# Patient Record
Sex: Female | Born: 1970
Health system: Southern US, Community
[De-identification: ages and names within clinical notes are randomized; demographics above are authoritative.]

## PROBLEM LIST (undated history)

## (undated) DIAGNOSIS — T7840XA Allergy, unspecified, initial encounter: Secondary | ICD-10-CM

## (undated) DIAGNOSIS — J45909 Unspecified asthma, uncomplicated: Secondary | ICD-10-CM

## (undated) HISTORY — DX: Unspecified asthma, uncomplicated: J45.909

## (undated) HISTORY — DX: Allergy, unspecified, initial encounter: T78.40XA

---

## 1999-02-26 ENCOUNTER — Encounter (INDEPENDENT_AMBULATORY_CARE_PROVIDER_SITE_OTHER): Payer: Self-pay | Admitting: *Deleted

## 1999-02-26 LAB — CONVERTED CEMR LAB

## 1999-09-07 ENCOUNTER — Encounter: Admission: RE | Admit: 1999-09-07 | Discharge: 1999-09-07 | Payer: Self-pay | Admitting: Family Medicine

## 1999-10-06 ENCOUNTER — Encounter: Admission: RE | Admit: 1999-10-06 | Discharge: 1999-10-06 | Payer: Self-pay | Admitting: Family Medicine

## 1999-12-06 ENCOUNTER — Encounter: Admission: RE | Admit: 1999-12-06 | Discharge: 1999-12-06 | Payer: Self-pay | Admitting: Family Medicine

## 1999-12-20 ENCOUNTER — Encounter: Admission: RE | Admit: 1999-12-20 | Discharge: 1999-12-20 | Payer: Self-pay | Admitting: Family Medicine

## 2000-01-17 ENCOUNTER — Encounter: Admission: RE | Admit: 2000-01-17 | Discharge: 2000-01-17 | Payer: Self-pay | Admitting: Family Medicine

## 2000-04-07 ENCOUNTER — Encounter: Admission: RE | Admit: 2000-04-07 | Discharge: 2000-04-07 | Payer: Self-pay | Admitting: Family Medicine

## 2000-04-26 ENCOUNTER — Encounter: Admission: RE | Admit: 2000-04-26 | Discharge: 2000-04-26 | Payer: Self-pay | Admitting: Family Medicine

## 2000-05-03 ENCOUNTER — Encounter: Admission: RE | Admit: 2000-05-03 | Discharge: 2000-05-03 | Payer: Self-pay | Admitting: Family Medicine

## 2000-05-09 ENCOUNTER — Ambulatory Visit (HOSPITAL_BASED_OUTPATIENT_CLINIC_OR_DEPARTMENT_OTHER): Admission: RE | Admit: 2000-05-09 | Discharge: 2000-05-09 | Payer: Self-pay | Admitting: Otolaryngology

## 2000-06-21 ENCOUNTER — Encounter: Admission: RE | Admit: 2000-06-21 | Discharge: 2000-06-21 | Payer: Self-pay | Admitting: Family Medicine

## 2003-12-23 ENCOUNTER — Other Ambulatory Visit: Admission: RE | Admit: 2003-12-23 | Discharge: 2003-12-23 | Payer: Self-pay | Admitting: Family Medicine

## 2004-07-27 ENCOUNTER — Ambulatory Visit (HOSPITAL_COMMUNITY): Admission: RE | Admit: 2004-07-27 | Discharge: 2004-07-27 | Payer: Self-pay | Admitting: Family Medicine

## 2005-11-01 ENCOUNTER — Other Ambulatory Visit: Admission: RE | Admit: 2005-11-01 | Discharge: 2005-11-01 | Payer: Self-pay | Admitting: Family Medicine

## 2006-08-25 ENCOUNTER — Encounter (INDEPENDENT_AMBULATORY_CARE_PROVIDER_SITE_OTHER): Payer: Self-pay | Admitting: *Deleted

## 2006-11-09 ENCOUNTER — Other Ambulatory Visit: Admission: RE | Admit: 2006-11-09 | Discharge: 2006-11-09 | Payer: Self-pay | Admitting: Family Medicine

## 2008-01-08 ENCOUNTER — Other Ambulatory Visit: Admission: RE | Admit: 2008-01-08 | Discharge: 2008-01-08 | Payer: Self-pay | Admitting: Family Medicine

## 2008-03-30 ENCOUNTER — Emergency Department (HOSPITAL_COMMUNITY): Admission: EM | Admit: 2008-03-30 | Discharge: 2008-03-30 | Payer: Self-pay | Admitting: Emergency Medicine

## 2008-10-20 ENCOUNTER — Encounter: Admission: RE | Admit: 2008-10-20 | Discharge: 2008-10-20 | Payer: Self-pay | Admitting: Pulmonary Disease

## 2009-04-20 ENCOUNTER — Other Ambulatory Visit: Admission: RE | Admit: 2009-04-20 | Discharge: 2009-04-20 | Payer: Self-pay | Admitting: Family Medicine

## 2010-04-27 ENCOUNTER — Other Ambulatory Visit: Admission: RE | Admit: 2010-04-27 | Discharge: 2010-04-27 | Payer: Self-pay | Admitting: Family Medicine

## 2010-07-17 ENCOUNTER — Encounter: Payer: Self-pay | Admitting: Family Medicine

## 2010-07-20 ENCOUNTER — Ambulatory Visit (HOSPITAL_COMMUNITY)
Admission: RE | Admit: 2010-07-20 | Discharge: 2010-07-20 | Payer: Self-pay | Source: Home / Self Care | Attending: Family Medicine | Admitting: Family Medicine

## 2010-11-12 NOTE — Op Note (Signed)
[place]. [facility]  Patient:    Suzanne Webster, Suzanne Webster                       MRN: [id number] Proc. Date: 05/09/00 Adm. Date:  [id number] Attending:  Carlean Purl CC:         Dr. Arlie Solomons, [facility]   Operative Report  PREOPERATIVE DIAGNOSIS:  Right tympanic membrane perforation with conductive hearing loss.  POSTOPERATIVE DIAGNOSIS:  Right tympanic membrane perforation with conductive hearing loss.  OPERATION:  Postauricular approach to right tympanoplasty.  SURGEON:  Kristine Garbe. Ezzard Standing, M.D.  ANESTHESIA:  General endotracheal.  COMPLICATIONS:  None.  BRIEF CLINICAL NOTE:  Suzanne Webster is a 40 year old female who has had longstanding right TM perforation.  On exam in the office, she has an approximately 50% perforation of her right TM with the entire anterior half of the TM perforated.  She has a fair amount of tympanosclerosis on the TM in addition to her conductive hearing loss.  FINDINGS AT SURGERY:  A 50% anterior central TM perforation, tympanosclerosis of the posterior portion of the TM, partial erosion of the long process of the incus with fibrous adhesion to the stapes superstructure.  DESCRIPTION OF PROCEDURE:  After adequate endotracheal anesthesia, the patient received 1 g of Ancef IV preoperatively.  Ear was prepped with Betadine solution and draped out with sterile towels.  The ear was then irrigated with saline and cleaned.  On examination, the patient had a large anterior TM perforation.  Had a fair amount of tympanosclerosis over the posterior portion of the TM.  A postauricular incision was made, and a temporalis fascia graft was harvested and set aside to dry.  The postauricular incision was then extended inferiorly.  The ear was reflected anteriorly, and the ear canal was entered through a postauricular approach.  This allowed better visualization of the anterior portion of the perforation.  The  perforation edges were then freshened up with a pick and cup forceps.  A posterior-based tympanomeatal flap was then elevated, and the middle ear space was entered after elevating the annulus.  There were some adhesions over the long process of the incus, and these were lysed with the pick.  There was partial erosion of the long process of the incus and more of a fibrous union between the incus and stapes superstructure, but this union was still intact and it was elected not to perform any type of ossiculoplasty at this point.  After releasing some adhesion around the stapes superstructure, the stapes was mobile, as was the incus.  The temporalis fascia graft was then cut to appropriate size.  This was positioned underneath the malleus and medial to the tympanomeatal flap, covering the entire anterior one-half of the undersurface of the TM, covering the entire perforation site.  The middle ear space was then packed with Gelfoam soaked in colimycin.  The fascia graft and posterior-based tympanomeatal flap was then brought back in the ear canal, and the fascial graft was seen to cover the entire perforation site.  The ear canal was then packed with Gelfoam soaked in colimycin.  The ear was brought back.  The postauricular incision was closed with 3-0 chromic suture subcutaneously and 4-0 nylon on the skin.  Remaining ear canal was then packed with Gelfoam soaked in colimycin and a mastoid dressing was applied.  The patient was awakened from anesthesia and transferred to recovery postoperatively doing well.  DISPOSITION:  Suzanne Webster is discharged home  later this morning on Keflex 500 mg b.i.d. for four days, Tylenol and Tylenol No. 3 p.r.n. pain.  We will have her follow up in my office in six days for a recheck and removal of postauricular sutures. DD:  05/09/00 TD:  05/09/00 Job: [id number]/[id number]

## 2011-04-05 ENCOUNTER — Other Ambulatory Visit: Payer: Self-pay | Admitting: Gastroenterology

## 2011-04-05 DIAGNOSIS — R1011 Right upper quadrant pain: Secondary | ICD-10-CM

## 2011-04-11 ENCOUNTER — Ambulatory Visit
Admission: RE | Admit: 2011-04-11 | Discharge: 2011-04-11 | Disposition: A | Discharge status: Home / Self Care | Payer: BC Managed Care – PPO | Source: Ambulatory Visit | Attending: Gastroenterology | Admitting: Gastroenterology

## 2011-04-11 DIAGNOSIS — R1011 Right upper quadrant pain: Secondary | ICD-10-CM

## 2011-05-02 ENCOUNTER — Other Ambulatory Visit: Payer: Self-pay | Admitting: Family Medicine

## 2011-05-02 ENCOUNTER — Other Ambulatory Visit (HOSPITAL_COMMUNITY)
Admission: RE | Admit: 2011-05-02 | Discharge: 2011-05-02 | Disposition: A | Discharge status: Home / Self Care | Payer: BC Managed Care – PPO | Source: Ambulatory Visit | Attending: Family Medicine | Admitting: Family Medicine

## 2011-05-02 DIAGNOSIS — Z01419 Encounter for gynecological examination (general) (routine) without abnormal findings: Secondary | ICD-10-CM | POA: Insufficient documentation

## 2011-05-02 DIAGNOSIS — Z113 Encounter for screening for infections with a predominantly sexual mode of transmission: Secondary | ICD-10-CM | POA: Insufficient documentation

## 2011-09-08 ENCOUNTER — Other Ambulatory Visit (HOSPITAL_COMMUNITY): Payer: Self-pay | Admitting: Family Medicine

## 2011-09-08 DIAGNOSIS — Z1231 Encounter for screening mammogram for malignant neoplasm of breast: Secondary | ICD-10-CM

## 2011-10-10 ENCOUNTER — Ambulatory Visit (HOSPITAL_COMMUNITY)
Admission: RE | Admit: 2011-10-10 | Discharge: 2011-10-10 | Disposition: A | Discharge status: Home / Self Care | Payer: BC Managed Care – PPO | Source: Ambulatory Visit | Attending: Family Medicine | Admitting: Family Medicine

## 2011-10-10 DIAGNOSIS — Z1231 Encounter for screening mammogram for malignant neoplasm of breast: Secondary | ICD-10-CM | POA: Insufficient documentation

## 2011-10-13 ENCOUNTER — Other Ambulatory Visit: Payer: Self-pay | Admitting: Gastroenterology

## 2011-10-13 DIAGNOSIS — D1803 Hemangioma of intra-abdominal structures: Secondary | ICD-10-CM

## 2011-10-18 ENCOUNTER — Other Ambulatory Visit: Payer: Self-pay | Admitting: Family Medicine

## 2011-10-18 DIAGNOSIS — R928 Other abnormal and inconclusive findings on diagnostic imaging of breast: Secondary | ICD-10-CM

## 2011-10-20 ENCOUNTER — Ambulatory Visit
Admission: RE | Admit: 2011-10-20 | Discharge: 2011-10-20 | Disposition: A | Discharge status: Home / Self Care | Payer: BC Managed Care – PPO | Source: Ambulatory Visit | Attending: Family Medicine | Admitting: Family Medicine

## 2011-10-20 DIAGNOSIS — R928 Other abnormal and inconclusive findings on diagnostic imaging of breast: Secondary | ICD-10-CM

## 2011-10-21 ENCOUNTER — Other Ambulatory Visit: Payer: Self-pay

## 2011-10-25 ENCOUNTER — Ambulatory Visit
Admission: RE | Admit: 2011-10-25 | Discharge: 2011-10-25 | Disposition: A | Discharge status: Home / Self Care | Payer: BC Managed Care – PPO | Source: Ambulatory Visit | Attending: Gastroenterology | Admitting: Gastroenterology

## 2011-10-25 DIAGNOSIS — D1803 Hemangioma of intra-abdominal structures: Secondary | ICD-10-CM

## 2012-03-15 ENCOUNTER — Encounter (HOSPITAL_COMMUNITY): Payer: Self-pay

## 2012-03-15 ENCOUNTER — Emergency Department (HOSPITAL_COMMUNITY)
Admission: EM | Admit: 2012-03-15 | Discharge: 2012-03-16 | Disposition: A | Discharge status: Home / Self Care | Payer: Self-pay | Attending: Emergency Medicine | Admitting: Emergency Medicine

## 2012-03-15 DIAGNOSIS — R5381 Other malaise: Secondary | ICD-10-CM | POA: Insufficient documentation

## 2012-03-15 DIAGNOSIS — M791 Myalgia, unspecified site: Secondary | ICD-10-CM

## 2012-03-15 DIAGNOSIS — R079 Chest pain, unspecified: Secondary | ICD-10-CM | POA: Insufficient documentation

## 2012-03-15 DIAGNOSIS — R51 Headache: Secondary | ICD-10-CM | POA: Insufficient documentation

## 2012-03-15 DIAGNOSIS — IMO0001 Reserved for inherently not codable concepts without codable children: Secondary | ICD-10-CM | POA: Insufficient documentation

## 2012-03-15 DIAGNOSIS — R531 Weakness: Secondary | ICD-10-CM

## 2012-03-15 DIAGNOSIS — R110 Nausea: Secondary | ICD-10-CM | POA: Insufficient documentation

## 2012-03-15 NOTE — ED Notes (Addendum)
Pt presents with multiple complaints of chest, head, neck, right leg, and lower back. Chest pain is intermittent. Right leg pain radiates from calf to buttocks. All symptoms started four days ago. Pt reports weakness, dizziness, and nausea. Pt also reports persistent thirst for four days. Pt started taking Mobic and Flexiril eight days ago for migraine headaches. Last dose of Mobic was this am. Skin is warm and dry, respirations are equal and unlabored, pt is A&Ox4. EKG has been done

## 2012-03-16 ENCOUNTER — Emergency Department (HOSPITAL_COMMUNITY): Payer: Self-pay

## 2012-03-16 LAB — D-DIMER, QUANTITATIVE: D-Dimer, Quant: <0.27 ug/mL-FEU (ref 0.00–0.48)

## 2012-03-16 LAB — CBC WITH DIFFERENTIAL/PLATELET
Basophils Absolute: 0 10*3/uL (ref 0.0–0.1)
Eosinophils Relative: 1 % (ref 0–5)
HCT: 40.6 % (ref 36.0–46.0)
Hemoglobin: 14 g/dL (ref 12.0–15.0)
Lymphocytes Relative: 33 % (ref 12–46)
Lymphs Abs: 2.2 10*3/uL (ref 0.7–4.0)
MCV: 87.1 fL (ref 78.0–100.0)
Monocytes Absolute: 0.7 10*3/uL (ref 0.1–1.0)
Monocytes Relative: 10 % (ref 3–12)
Neutro Abs: 3.8 10*3/uL (ref 1.7–7.7)
RBC: 4.66 MIL/uL (ref 3.87–5.11)
WBC: 6.8 10*3/uL (ref 4.0–10.5)

## 2012-03-16 LAB — BASIC METABOLIC PANEL
BUN: 9 mg/dL (ref 6–23)
CO2: 23 mEq/L (ref 19–32)
Chloride: 103 mEq/L (ref 96–112)
Creatinine, Ser: 0.71 mg/dL (ref 0.50–1.10)
Glucose, Bld: 100 mg/dL — ABNORMAL HIGH (ref 70–99)

## 2012-03-16 LAB — URINE MICROSCOPIC-ADD ON

## 2012-03-16 LAB — URINALYSIS, ROUTINE W REFLEX MICROSCOPIC
Bilirubin Urine: NEGATIVE
Glucose, UA: NEGATIVE mg/dL
Nitrite: NEGATIVE
Specific Gravity, Urine: 1.012 (ref 1.005–1.030)
pH: 6 (ref 5.0–8.0)

## 2012-03-16 MED ORDER — DIPHENHYDRAMINE HCL 50 MG/ML IJ SOLN
25.0000 mg | Freq: Once | INTRAMUSCULAR | Status: AC
  Administered 2012-03-16: 25 mg via INTRAVENOUS
  Filled 2012-03-16: qty 1

## 2012-03-16 MED ORDER — TRAMADOL HCL 50 MG PO TABS: 50.0000 mg | ORAL_TABLET | Freq: Four times a day (QID) | ORAL | Status: DC | PRN

## 2012-03-16 MED ORDER — SODIUM CHLORIDE 0.9 % IV SOLN: 1000.0000 mL | INTRAVENOUS | Status: DC

## 2012-03-16 MED ORDER — SODIUM CHLORIDE 0.9 % IV SOLN
1000.0000 mL | Freq: Once | INTRAVENOUS | Status: AC
  Administered 2012-03-16: 1000 mL via INTRAVENOUS

## 2012-03-16 MED ORDER — METOCLOPRAMIDE HCL 5 MG/ML IJ SOLN
10.0000 mg | Freq: Once | INTRAMUSCULAR | Status: AC
  Administered 2012-03-16: 10 mg via INTRAVENOUS
  Filled 2012-03-16: qty 2

## 2012-03-16 NOTE — ED Provider Notes (Signed)
History     CSN: [id number]  Arrival date & time 03/15/12  2143   First MD Initiated Contact with Patient 03/15/12 2348      Chief Complaint  Patient presents with  . Pain    (Consider location/radiation/quality/duration/timing/severity/associated sxs/prior treatment) HPI 41 year old female presents to the emergency room with multiple complaints. She reports she has had 4 days of posterior headache radiating down her neck, generalized weakness, fatigue, nausea. She has had no vomiting. She has been extremely thirsty for the last 4 days as well. Patient reports she's been taking Mobic and Flexeril for the last several days for intermittent headaches. She reports no improvement with these medications. No fever no chills, no sick contacts. She has had no cough. Patient reports shortness of breath and one to 2 seconds of chest and flank pain, which occur both in left and right side. Patient reports a burning sensation from her right knee up to her buttock with prolonged standing. She denies any urinary or bowel dysfunction. She denies any swelling to her lower extremities, she is not a smoker, no history of PE DVT, no prolonged immobilization or other risk factors for blood clots.  History reviewed. No pertinent past medical history.  History reviewed. No pertinent past surgical history.  No family history on file.  History  Substance Use Topics  . Smoking status: Never Smoker   . Smokeless tobacco: Not on file  . Alcohol Use: No    OB History    Grav Para Term Preterm Abortions TAB SAB Ect Mult Living                  Review of Systems  All other systems reviewed and are negative.    Allergies  Review of patient's allergies indicates no known allergies.  Home Medications   Current Outpatient Rx  Name Route Sig Dispense Refill  . CYCLOBENZAPRINE HCL 10 MG PO TABS Oral Take 10 mg by mouth at bedtime.    . MELOXICAM 15 MG PO TABS Oral Take 15 mg by mouth daily.    . ADULT  MULTIVITAMIN W/MINERALS CH Oral Take 1 tablet by mouth daily.    . TRAMADOL HCL 50 MG PO TABS Oral Take 1 tablet (50 mg total) by mouth every 6 (six) hours as needed for pain. 15 tablet 0    BP 122/71  Pulse 79  Temp 98.4 F (36.9 C) (Oral)  Resp 16  Ht 5\' 7"  (1.702 m)  Wt 152 lb (68.947 kg)  BMI 23.81 kg/m2  SpO2 100%  LMP 02/25/2012  Physical Exam  Nursing note and vitals reviewed. Constitutional: She is oriented to person, place, and time. She appears well-developed and well-nourished. She appears distressed (Anxious appearing, uncomfortable).  HENT:  Head: Normocephalic and atraumatic.  Nose: Nose normal.  Mouth/Throat: Oropharynx is clear and moist.  Eyes: Conjunctivae normal and EOM are normal. Pupils are equal, round, and reactive to light.  Neck: Normal range of motion. Neck supple. No JVD present. No tracheal deviation present. No thyromegaly present.  Cardiovascular: Normal rate, regular rhythm, normal heart sounds and intact distal pulses.  Exam reveals no gallop and no friction rub.   No murmur heard. Pulmonary/Chest: Effort normal and breath sounds normal. No stridor. No respiratory distress. She has no wheezes. She has no rales. She exhibits no tenderness.  Abdominal: Soft. Bowel sounds are normal. She exhibits no distension and no mass. There is no tenderness. There is no rebound and no guarding.  Musculoskeletal: Normal range  of motion. She exhibits tenderness (tenderness to palpation and straight leg raise of her right leg without significant abnormalities noted). She exhibits no edema.  Lymphadenopathy:    She has no cervical adenopathy.  Neurological: She is alert and oriented to person, place, and time. She exhibits normal muscle tone. Coordination normal.  Skin: Skin is warm and dry. No rash noted. No erythema. No pallor.  Psychiatric: She has a normal mood and affect. Her behavior is normal. Judgment and thought content normal.    ED Course  Procedures  (including critical care time)  Labs Reviewed  BASIC METABOLIC PANEL - Abnormal; Notable for the following:    Glucose, Bld 100 (*)     All other components within normal limits  URINALYSIS, ROUTINE W REFLEX MICROSCOPIC - Abnormal; Notable for the following:    Hgb urine dipstick TRACE (*)     All other components within normal limits  URINE MICROSCOPIC-ADD ON - Abnormal; Notable for the following:    Squamous Epithelial / LPF FEW (*)     All other components within normal limits  CBC WITH DIFFERENTIAL  D-DIMER, QUANTITATIVE   Dg Chest 2 View  03/16/2012  *RADIOLOGY REPORT*  Clinical Data: Chest pain and shortness of breath.  CHEST - 2 VIEW  Comparison: Single view of the chest 10/20/2008.  Findings: Lungs are clear.  Heart size is normal.  No pneumothorax or pleural fluid.  IMPRESSION: Negative chest.   Original Report Authenticated By: Bernadene Bell. Maricela Curet, M.D.     Date: 03/15/2012  Rate: 108  Rhythm: sinus tachycardia  QRS Axis: normal  Intervals: normal  ST/T Wave abnormalities: normal  Conduction Disutrbances:none  Narrative Interpretation:   Old EKG Reviewed: none available    1. Myalgia   2. Headache   3. Weakness generalized       MDM  41 year old female with multiple complaints, workup unremarkable. She is feeling better after treatment. We'll discharge to followup with her primary care Dr.        Olivia Mackie, MD 03/16/12 [phone]

## 2012-07-20 ENCOUNTER — Other Ambulatory Visit (HOSPITAL_COMMUNITY)
Admission: RE | Admit: 2012-07-20 | Discharge: 2012-07-20 | Disposition: A | Discharge status: Home / Self Care | Payer: 59 | Source: Ambulatory Visit | Attending: Family Medicine | Admitting: Family Medicine

## 2012-07-20 ENCOUNTER — Other Ambulatory Visit: Payer: Self-pay | Admitting: Family Medicine

## 2012-07-20 DIAGNOSIS — Z01419 Encounter for gynecological examination (general) (routine) without abnormal findings: Secondary | ICD-10-CM | POA: Insufficient documentation

## 2012-10-24 ENCOUNTER — Other Ambulatory Visit: Payer: Self-pay | Admitting: Gastroenterology

## 2012-10-24 DIAGNOSIS — D1803 Hemangioma of intra-abdominal structures: Secondary | ICD-10-CM

## 2012-10-24 DIAGNOSIS — R51 Headache: Secondary | ICD-10-CM

## 2012-10-29 ENCOUNTER — Ambulatory Visit
Admission: RE | Admit: 2012-10-29 | Discharge: 2012-10-29 | Disposition: A | Discharge status: Home / Self Care | Payer: 59 | Source: Ambulatory Visit | Attending: Gastroenterology | Admitting: Gastroenterology

## 2012-10-29 DIAGNOSIS — D1803 Hemangioma of intra-abdominal structures: Secondary | ICD-10-CM

## 2012-12-12 ENCOUNTER — Other Ambulatory Visit (HOSPITAL_COMMUNITY): Payer: Self-pay | Admitting: Family Medicine

## 2012-12-12 DIAGNOSIS — Z1231 Encounter for screening mammogram for malignant neoplasm of breast: Secondary | ICD-10-CM

## 2013-01-04 ENCOUNTER — Ambulatory Visit (HOSPITAL_COMMUNITY)
Admission: RE | Admit: 2013-01-04 | Discharge: 2013-01-04 | Disposition: A | Discharge status: Home / Self Care | Payer: 59 | Source: Ambulatory Visit | Attending: Family Medicine | Admitting: Family Medicine

## 2013-01-04 DIAGNOSIS — Z1231 Encounter for screening mammogram for malignant neoplasm of breast: Secondary | ICD-10-CM

## 2013-07-18 ENCOUNTER — Other Ambulatory Visit: Payer: Self-pay | Admitting: Family Medicine

## 2013-07-18 DIAGNOSIS — R109 Unspecified abdominal pain: Secondary | ICD-10-CM

## 2013-07-19 ENCOUNTER — Ambulatory Visit
Admission: RE | Admit: 2013-07-19 | Discharge: 2013-07-19 | Disposition: A | Discharge status: Home / Self Care | Payer: 59 | Source: Ambulatory Visit | Attending: Family Medicine | Admitting: Family Medicine

## 2013-07-19 DIAGNOSIS — R109 Unspecified abdominal pain: Secondary | ICD-10-CM

## 2013-07-19 MED ORDER — IOHEXOL 300 MG/ML  SOLN
100.0000 mL | Freq: Once | INTRAMUSCULAR | Status: AC | PRN
  Administered 2013-07-19: 100 mL via INTRAVENOUS

## 2013-08-28 ENCOUNTER — Other Ambulatory Visit: Payer: Self-pay | Admitting: Family Medicine

## 2013-08-28 ENCOUNTER — Other Ambulatory Visit (HOSPITAL_COMMUNITY)
Admission: RE | Admit: 2013-08-28 | Discharge: 2013-08-28 | Disposition: A | Discharge status: Home / Self Care | Payer: 59 | Source: Ambulatory Visit | Attending: Family Medicine | Admitting: Family Medicine

## 2013-08-28 DIAGNOSIS — Z01419 Encounter for gynecological examination (general) (routine) without abnormal findings: Secondary | ICD-10-CM | POA: Insufficient documentation

## 2015-09-07 ENCOUNTER — Other Ambulatory Visit: Payer: Self-pay | Admitting: Family Medicine

## 2015-09-07 DIAGNOSIS — Z1231 Encounter for screening mammogram for malignant neoplasm of breast: Secondary | ICD-10-CM

## 2015-11-11 ENCOUNTER — Ambulatory Visit
Admission: RE | Admit: 2015-11-11 | Discharge: 2015-11-11 | Disposition: A | Discharge status: Home / Self Care | Payer: 59 | Source: Ambulatory Visit | Attending: Family Medicine | Admitting: Family Medicine

## 2015-11-11 DIAGNOSIS — Z1231 Encounter for screening mammogram for malignant neoplasm of breast: Secondary | ICD-10-CM

## 2016-09-05 ENCOUNTER — Other Ambulatory Visit (HOSPITAL_COMMUNITY)
Admission: RE | Admit: 2016-09-05 | Discharge: 2016-09-05 | Disposition: A | Discharge status: Home / Self Care | Payer: 59 | Source: Ambulatory Visit | Attending: Family Medicine | Admitting: Family Medicine

## 2016-09-05 ENCOUNTER — Other Ambulatory Visit: Payer: Self-pay | Admitting: Family Medicine

## 2016-09-05 DIAGNOSIS — Z01411 Encounter for gynecological examination (general) (routine) with abnormal findings: Secondary | ICD-10-CM | POA: Insufficient documentation

## 2016-09-08 LAB — CYTOLOGY - PAP: DIAGNOSIS: NEGATIVE

## 2017-08-02 DIAGNOSIS — M545 Low back pain: Secondary | ICD-10-CM | POA: Diagnosis not present

## 2017-08-02 DIAGNOSIS — M549 Dorsalgia, unspecified: Secondary | ICD-10-CM | POA: Diagnosis not present

## 2017-09-12 DIAGNOSIS — E559 Vitamin D deficiency, unspecified: Secondary | ICD-10-CM | POA: Diagnosis not present

## 2017-09-12 DIAGNOSIS — E7800 Pure hypercholesterolemia, unspecified: Secondary | ICD-10-CM | POA: Diagnosis not present

## 2017-09-12 DIAGNOSIS — Z0000 Encounter for general adult medical examination without abnormal findings: Secondary | ICD-10-CM | POA: Diagnosis not present

## 2017-09-21 ENCOUNTER — Other Ambulatory Visit: Payer: Self-pay | Admitting: Family Medicine

## 2017-09-21 DIAGNOSIS — Z1231 Encounter for screening mammogram for malignant neoplasm of breast: Secondary | ICD-10-CM

## 2017-10-17 ENCOUNTER — Ambulatory Visit: Payer: 59

## 2017-10-18 ENCOUNTER — Ambulatory Visit: Payer: 59

## 2017-10-18 ENCOUNTER — Ambulatory Visit
Admission: RE | Admit: 2017-10-18 | Discharge: 2017-10-18 | Disposition: A | Discharge status: Home / Self Care | Payer: BLUE CROSS/BLUE SHIELD | Source: Ambulatory Visit | Attending: Family Medicine | Admitting: Family Medicine

## 2017-10-18 DIAGNOSIS — Z1231 Encounter for screening mammogram for malignant neoplasm of breast: Secondary | ICD-10-CM

## 2017-11-16 DIAGNOSIS — R079 Chest pain, unspecified: Secondary | ICD-10-CM | POA: Diagnosis not present

## 2018-04-03 DIAGNOSIS — K5792 Diverticulitis of intestine, part unspecified, without perforation or abscess without bleeding: Secondary | ICD-10-CM | POA: Diagnosis not present

## 2018-04-03 DIAGNOSIS — R350 Frequency of micturition: Secondary | ICD-10-CM | POA: Diagnosis not present

## 2018-04-03 DIAGNOSIS — Z8619 Personal history of other infectious and parasitic diseases: Secondary | ICD-10-CM | POA: Diagnosis not present

## 2018-04-03 DIAGNOSIS — Z114 Encounter for screening for human immunodeficiency virus [HIV]: Secondary | ICD-10-CM | POA: Diagnosis not present

## 2018-05-30 DIAGNOSIS — Z114 Encounter for screening for human immunodeficiency virus [HIV]: Secondary | ICD-10-CM | POA: Diagnosis not present

## 2018-07-31 DIAGNOSIS — L570 Actinic keratosis: Secondary | ICD-10-CM | POA: Diagnosis not present

## 2018-09-18 DIAGNOSIS — J309 Allergic rhinitis, unspecified: Secondary | ICD-10-CM | POA: Diagnosis not present

## 2018-09-18 DIAGNOSIS — F411 Generalized anxiety disorder: Secondary | ICD-10-CM | POA: Diagnosis not present

## 2018-09-18 DIAGNOSIS — E559 Vitamin D deficiency, unspecified: Secondary | ICD-10-CM | POA: Diagnosis not present

## 2018-09-18 DIAGNOSIS — E7800 Pure hypercholesterolemia, unspecified: Secondary | ICD-10-CM | POA: Diagnosis not present

## 2018-09-21 ENCOUNTER — Emergency Department (HOSPITAL_COMMUNITY): Payer: BLUE CROSS/BLUE SHIELD

## 2018-09-21 ENCOUNTER — Other Ambulatory Visit: Payer: Self-pay

## 2018-09-21 ENCOUNTER — Emergency Department (HOSPITAL_COMMUNITY)
Admission: EM | Admit: 2018-09-21 | Discharge: 2018-09-21 | Disposition: A | Discharge status: Home / Self Care | Payer: BLUE CROSS/BLUE SHIELD | Attending: Emergency Medicine | Admitting: Emergency Medicine

## 2018-09-21 ENCOUNTER — Encounter (HOSPITAL_COMMUNITY): Payer: Self-pay | Admitting: Radiology

## 2018-09-21 DIAGNOSIS — R630 Anorexia: Secondary | ICD-10-CM | POA: Insufficient documentation

## 2018-09-21 DIAGNOSIS — R110 Nausea: Secondary | ICD-10-CM | POA: Insufficient documentation

## 2018-09-21 DIAGNOSIS — Z79899 Other long term (current) drug therapy: Secondary | ICD-10-CM | POA: Insufficient documentation

## 2018-09-21 DIAGNOSIS — R109 Unspecified abdominal pain: Secondary | ICD-10-CM | POA: Diagnosis not present

## 2018-09-21 DIAGNOSIS — R0789 Other chest pain: Secondary | ICD-10-CM | POA: Diagnosis not present

## 2018-09-21 DIAGNOSIS — R002 Palpitations: Secondary | ICD-10-CM | POA: Diagnosis not present

## 2018-09-21 DIAGNOSIS — R0602 Shortness of breath: Secondary | ICD-10-CM | POA: Diagnosis not present

## 2018-09-21 DIAGNOSIS — I10 Essential (primary) hypertension: Secondary | ICD-10-CM | POA: Diagnosis not present

## 2018-09-21 DIAGNOSIS — R1011 Right upper quadrant pain: Secondary | ICD-10-CM | POA: Insufficient documentation

## 2018-09-21 DIAGNOSIS — R000 Tachycardia, unspecified: Secondary | ICD-10-CM | POA: Diagnosis not present

## 2018-09-21 DIAGNOSIS — R079 Chest pain, unspecified: Secondary | ICD-10-CM | POA: Diagnosis not present

## 2018-09-21 LAB — CBC WITH DIFFERENTIAL/PLATELET
Abs Immature Granulocytes: 0.01 10*3/uL (ref 0.00–0.07)
BASOS ABS: 0 10*3/uL (ref 0.0–0.1)
Basophils Relative: 1 %
EOS ABS: 0 10*3/uL (ref 0.0–0.5)
EOS PCT: 1 %
HCT: 46 % (ref 36.0–46.0)
Hemoglobin: 14.5 g/dL (ref 12.0–15.0)
Immature Granulocytes: 0 %
Lymphocytes Relative: 45 %
Lymphs Abs: 3.7 10*3/uL (ref 0.7–4.0)
MCH: 29.4 pg (ref 26.0–34.0)
MCHC: 31.5 g/dL (ref 30.0–36.0)
MCV: 93.1 fL (ref 80.0–100.0)
Monocytes Absolute: 0.7 10*3/uL (ref 0.1–1.0)
Monocytes Relative: 8 %
Neutro Abs: 3.7 10*3/uL (ref 1.7–7.7)
Neutrophils Relative %: 45 %
Platelets: 193 10*3/uL (ref 150–400)
RBC: 4.94 MIL/uL (ref 3.87–5.11)
RDW: 12.1 % (ref 11.5–15.5)
WBC: 8.1 10*3/uL (ref 4.0–10.5)
nRBC: 0 % (ref 0.0–0.2)

## 2018-09-21 LAB — URINALYSIS, ROUTINE W REFLEX MICROSCOPIC
BILIRUBIN URINE: NEGATIVE
Glucose, UA: NEGATIVE mg/dL
Ketones, ur: NEGATIVE mg/dL
LEUKOCYTE UA: NEGATIVE
Nitrite: NEGATIVE
Protein, ur: NEGATIVE mg/dL
SPECIFIC GRAVITY, URINE: 1.014 (ref 1.005–1.030)
pH: 6 (ref 5.0–8.0)

## 2018-09-21 LAB — COMPREHENSIVE METABOLIC PANEL
ALBUMIN: 4 g/dL (ref 3.5–5.0)
ALT: 17 U/L (ref 0–44)
AST: 16 U/L (ref 15–41)
Alkaline Phosphatase: 50 U/L (ref 38–126)
Anion gap: 10 (ref 5–15)
BUN: 9 mg/dL (ref 6–20)
CO2: 24 mmol/L (ref 22–32)
Calcium: 9.5 mg/dL (ref 8.9–10.3)
Chloride: 104 mmol/L (ref 98–111)
Creatinine, Ser: 0.73 mg/dL (ref 0.44–1.00)
GFR calc Af Amer: >60 mL/min (ref >60)
GFR calc non Af Amer: >60 mL/min (ref >60)
GLUCOSE: 97 mg/dL (ref 70–99)
POTASSIUM: 4.3 mmol/L (ref 3.5–5.1)
SODIUM: 138 mmol/L (ref 135–145)
TOTAL PROTEIN: 6.9 g/dL (ref 6.5–8.1)
Total Bilirubin: 0.7 mg/dL (ref 0.3–1.2)

## 2018-09-21 LAB — C-REACTIVE PROTEIN: CRP: <0.8 mg/dL (ref <1.0)

## 2018-09-21 LAB — D-DIMER, QUANTITATIVE: D-Dimer, Quant: <0.27 ug/mL-FEU (ref 0.00–0.50)

## 2018-09-21 LAB — PROTIME-INR
INR: 1 (ref 0.8–1.2)
Prothrombin Time: 12.6 seconds (ref 11.4–15.2)

## 2018-09-21 LAB — PREGNANCY, URINE: PREG TEST UR: NEGATIVE

## 2018-09-21 LAB — LIPASE, BLOOD: Lipase: 23 U/L (ref 11–51)

## 2018-09-21 LAB — LACTATE DEHYDROGENASE: LDH: 128 U/L (ref 98–192)

## 2018-09-21 LAB — TROPONIN I: Troponin I: <0.03 ng/mL (ref <0.03)

## 2018-09-21 MED ORDER — ONDANSETRON HCL 4 MG/2ML IJ SOLN
4.0000 mg | Freq: Once | INTRAMUSCULAR | Status: AC
  Administered 2018-09-21: 4 mg via INTRAVENOUS
  Filled 2018-09-21: qty 2

## 2018-09-21 MED ORDER — IOHEXOL 300 MG/ML  SOLN
100.0000 mL | Freq: Once | INTRAMUSCULAR | Status: AC | PRN
  Administered 2018-09-21: 100 mL via INTRAVENOUS

## 2018-09-21 MED ORDER — ONDANSETRON 8 MG PO TBDP: 8.0000 mg | ORAL_TABLET | Freq: Three times a day (TID) | ORAL | 0 refills | Status: DC | PRN

## 2018-09-21 NOTE — Discharge Instructions (Addendum)
All the results in the ER are normal, labs and imaging. Chest Xray, Ultrasound of the abdomen and CT scan are normal. All the lab results are also normal.  We are not sure what is causing your symptoms. The workup in the ER is not complete, and is limited to screening for life threatening and emergent conditions only, so please see a primary care doctor for further evaluation.

## 2018-09-21 NOTE — ED Triage Notes (Signed)
Coming from home. CC of tachycardia and SOB. Afebrile. Pt states her friend recently returned from [place]. States her friend has no symptoms. Alert and oriented x4.

## 2018-09-21 NOTE — ED Provider Notes (Signed)
[place] EMERGENCY DEPARTMENT Provider Note   CSN: [id number] Arrival date & time: 09/21/18  0232    History   Chief Complaint Chief Complaint  Patient presents with  . Shortness of Breath    HPI Suzanne Webster is a 48 y.o. female.     HPI 48 year old female comes in with chief complaint of shortness of breath and palpitations. Patient states that she has been feeling unwell for the last 1 week.  She is been having intermittent episodes of abdominal discomfort, nausea without vomiting, anorexia, nonspecific lower extremity myalgias.  She is also had some chills.  Tonight while she was asleep, she started having palpitations and shortness of breath that woke her up from her sleep.  Pt has no hx of PE, DVT and denies any exogenous hormone (testosterone / estrogen) use, long distance travels or surgery in the past 6 weeks, active cancer, recent immobilization.  Patient denies any recent travel history or known exposure to someone sick.  She does indicate that her friends son who she has been around was in [place] 14 days ago -but that he has not been coughing or's feeling sick.  She denies any UTI-like symptoms, vaginal discharge, bleeding, pelvic disorder, abdominal surgical history.  Bowel movements have been normal.  No past medical history on file.  There are no active problems to display for this patient.   No past surgical history on file.   OB History   No obstetric history on file.      Home Medications    Prior to Admission medications   Medication Sig Start Date End Date Taking? Authorizing Provider  cyclobenzaprine (FLEXERIL) 10 MG tablet Take 10 mg by mouth at bedtime.    [provider]  meloxicam (MOBIC) 15 MG tablet Take 15 mg by mouth daily.    [provider]  Multiple Vitamin (MULTIVITAMIN WITH MINERALS) TABS Take 1 tablet by mouth daily.    [provider]  traMADol (ULTRAM) 50 MG tablet Take 1 tablet  (50 mg total) by mouth every 6 (six) hours as needed for pain. 03/16/12   Linton Flemings, MD    Family History No family history on file.  Social History Social History   Tobacco Use  . Smoking status: Never Smoker  Substance Use Topics  . Alcohol use: No  . Drug use: No     Allergies   Patient has no known allergies.   Review of Systems Review of Systems  Constitutional: Positive for activity change.  Respiratory: Negative for shortness of breath.   Cardiovascular: Positive for chest pain.  Gastrointestinal: Positive for abdominal pain.  Allergic/Immunologic: Negative for immunocompromised state.  Neurological: Negative for headaches.     Physical Exam Updated Vital Signs BP (!) 149/89 (BP Location: Right Arm)   Pulse 75   Temp 98.6 F (37 C) (Oral)   Resp 14   SpO2 100%   Physical Exam Vitals signs and nursing note reviewed.  Constitutional:      Appearance: She is well-developed.  HENT:     Head: Normocephalic and atraumatic.  Neck:     Musculoskeletal: Normal range of motion and neck supple.     Vascular: No JVD.  Cardiovascular:     Rate and Rhythm: Normal rate.  Pulmonary:     Effort: Pulmonary effort is normal.     Breath sounds: No decreased breath sounds, wheezing, rhonchi or rales.  Abdominal:     General: Bowel sounds are normal.  Tenderness: There is abdominal tenderness.     Comments: Right lower quadrant tenderness without rebound or guarding.  Negative McBurney's.  Musculoskeletal:     Right lower leg: No edema.     Left lower leg: No edema.  Skin:    General: Skin is warm and dry.  Neurological:     Mental Status: She is alert and oriented to person, place, and time.      ED Treatments / Results  Labs (all labs ordered are listed, but only abnormal results are displayed) Labs Reviewed  URINALYSIS, ROUTINE W REFLEX MICROSCOPIC - Abnormal; Notable for the following components:      Result Value   APPearance HAZY (*)    Hgb  urine dipstick SMALL (*)    Bacteria, UA RARE (*)    All other components within normal limits  CBC WITH DIFFERENTIAL/PLATELET  COMPREHENSIVE METABOLIC PANEL  PREGNANCY, URINE  LIPASE, BLOOD  PROTIME-INR  LACTATE DEHYDROGENASE  C-REACTIVE PROTEIN  TROPONIN I  D-DIMER, QUANTITATIVE (NOT AT [facility])    EKG EKG Interpretation  Date/Time:  Friday September 21 2018 02:43:52 EDT Ventricular Rate:  96 PR Interval:    QRS Duration: 88 QT Interval:  335 QTC Calculation: 424 R Axis:   69 Text Interpretation:  Sinus rhythm Consider right atrial enlargement No acute changes No significant change since last tracing Confirmed by Varney Biles ([id number]) on 09/21/2018 2:57:23 AM   Radiology Dg Chest Port 1 View  Result Date: 09/21/2018 CLINICAL DATA:  48 year old female with tachycardia and shortness of breath. EXAM: PORTABLE CHEST 1 VIEW COMPARISON:  Chest radiographs 03/16/2012. FINDINGS: Portable AP upright view at 0316 hours. Lung volumes and mediastinal contours are stable and within normal limits. Visualized tracheal air column is within normal limits. Incidental left nipple shadow. Allowing for portable technique the lungs are clear. No pneumothorax. No osseous abnormality identified. IMPRESSION: Negative.  No cardiopulmonary abnormality. Electronically Signed   By: Genevie Ann M.D.   On: 09/21/2018 03:41    Procedures Procedures (including critical care time)  Medications Ordered in ED Medications  ondansetron (ZOFRAN) injection 4 mg (has no administration in time range)     Initial Impression / Assessment and Plan / ED Course  I have reviewed the triage vital signs and the nursing notes.  Pertinent labs & imaging results that were available during my care of the patient were reviewed by me and considered in my medical decision making (see chart for details).  Clinical Course as of Sep 20 509  Fri Sep 21, 2018  0510 Patient's labs are overall reassuring.  Upon reassessment she reports  that she is still having abdominal pain.  Repeat abdominal exam reveals right upper quadrant tenderness being worse than right lower quadrant tenderness.  I ordered an ultrasound right upper quadrant.  She is also complaining of shortness of breath and weakness over the past few days.  Patient's well score is 0, she is PERC negative -however given her symptoms of worsening shortness of breath and weakness, we will get a d-dimer.  Reassessment will be done after the d-dimer and the ultrasound is resulted.   [AN]    Clinical Course User Index [AN] Varney Biles, MD      48 year old healthy female with a lot of nonspecific symptoms comes in with chief complaint of chest discomfort and shortness of breath.  She is been feeling unwell for the last few days.  The symptoms are nonspecific and not pointing to any specific source such as infection or  organ dysfunction. Tonight she woke up because of palpitations and shortness of breath.  EKG is reassuring.  Cardiovascular and pulmonary exam are normal right now.  We will check basic labs, get EKG and a single troponin assay. Goal be rule out ACS, pneumonia, lecture light abnormalities.  Additionally patient is also having some abdominal discomfort on my exam.  She has had nausea and poor appetite for the last several days.  No COVID-19 exposures or high risk behavior.  Abdominal exam revealed lower quadrant tenderness on the right side, if the white count is significantly elevated then we will pursue with a CT scan of the abdomen for further investigation.  Pain is intermittent and she had negative McBurney's and clinically we do not think she has a torsion.   Final Clinical Impressions(s) / ED Diagnoses   Final diagnoses:  RUQ abdominal pain    ED Discharge Orders    None       Varney Biles, MD 09/21/18 [phone]

## 2018-09-21 NOTE — ED Notes (Signed)
Pt left to CT

## 2018-09-21 NOTE — ED Notes (Signed)
Pt left to ULS

## 2018-12-12 DIAGNOSIS — M25551 Pain in right hip: Secondary | ICD-10-CM | POA: Diagnosis not present

## 2018-12-12 DIAGNOSIS — M7061 Trochanteric bursitis, right hip: Secondary | ICD-10-CM | POA: Diagnosis not present

## 2018-12-12 DIAGNOSIS — G5701 Lesion of sciatic nerve, right lower limb: Secondary | ICD-10-CM | POA: Diagnosis not present

## 2018-12-12 DIAGNOSIS — M545 Low back pain: Secondary | ICD-10-CM | POA: Diagnosis not present

## 2018-12-26 DIAGNOSIS — M25551 Pain in right hip: Secondary | ICD-10-CM | POA: Diagnosis not present

## 2018-12-26 DIAGNOSIS — M545 Low back pain: Secondary | ICD-10-CM | POA: Diagnosis not present

## 2019-01-02 DIAGNOSIS — L570 Actinic keratosis: Secondary | ICD-10-CM | POA: Diagnosis not present

## 2019-01-09 DIAGNOSIS — M25551 Pain in right hip: Secondary | ICD-10-CM | POA: Diagnosis not present

## 2019-01-09 DIAGNOSIS — M545 Low back pain: Secondary | ICD-10-CM | POA: Diagnosis not present

## 2019-01-10 DIAGNOSIS — T7849XD Other allergy, subsequent encounter: Secondary | ICD-10-CM | POA: Diagnosis not present

## 2019-02-06 DIAGNOSIS — M25551 Pain in right hip: Secondary | ICD-10-CM | POA: Diagnosis not present

## 2019-02-06 DIAGNOSIS — M25531 Pain in right wrist: Secondary | ICD-10-CM | POA: Diagnosis not present

## 2019-02-06 DIAGNOSIS — M545 Low back pain: Secondary | ICD-10-CM | POA: Diagnosis not present

## 2019-02-06 DIAGNOSIS — M542 Cervicalgia: Secondary | ICD-10-CM | POA: Diagnosis not present

## 2019-02-13 DIAGNOSIS — M25551 Pain in right hip: Secondary | ICD-10-CM | POA: Diagnosis not present

## 2019-02-13 DIAGNOSIS — M545 Low back pain: Secondary | ICD-10-CM | POA: Diagnosis not present

## 2019-02-20 DIAGNOSIS — M545 Low back pain: Secondary | ICD-10-CM | POA: Diagnosis not present

## 2019-02-20 DIAGNOSIS — M25551 Pain in right hip: Secondary | ICD-10-CM | POA: Diagnosis not present

## 2019-02-27 DIAGNOSIS — M25551 Pain in right hip: Secondary | ICD-10-CM | POA: Diagnosis not present

## 2019-02-27 DIAGNOSIS — M545 Low back pain: Secondary | ICD-10-CM | POA: Diagnosis not present

## 2019-03-05 DIAGNOSIS — L570 Actinic keratosis: Secondary | ICD-10-CM | POA: Diagnosis not present

## 2019-03-06 DIAGNOSIS — M25551 Pain in right hip: Secondary | ICD-10-CM | POA: Diagnosis not present

## 2019-03-06 DIAGNOSIS — M545 Low back pain: Secondary | ICD-10-CM | POA: Diagnosis not present

## 2019-03-13 DIAGNOSIS — M545 Low back pain: Secondary | ICD-10-CM | POA: Diagnosis not present

## 2019-03-13 DIAGNOSIS — M25551 Pain in right hip: Secondary | ICD-10-CM | POA: Diagnosis not present

## 2019-03-20 DIAGNOSIS — M25551 Pain in right hip: Secondary | ICD-10-CM | POA: Diagnosis not present

## 2019-03-20 DIAGNOSIS — M545 Low back pain: Secondary | ICD-10-CM | POA: Diagnosis not present

## 2019-04-09 DIAGNOSIS — L570 Actinic keratosis: Secondary | ICD-10-CM | POA: Diagnosis not present

## 2019-04-22 ENCOUNTER — Other Ambulatory Visit: Payer: Self-pay

## 2019-04-22 DIAGNOSIS — Z20822 Contact with and (suspected) exposure to covid-19: Secondary | ICD-10-CM

## 2019-04-23 LAB — NOVEL CORONAVIRUS, NAA: SARS-CoV-2, NAA: NOT DETECTED

## 2019-05-29 DIAGNOSIS — M25531 Pain in right wrist: Secondary | ICD-10-CM | POA: Diagnosis not present

## 2019-05-29 DIAGNOSIS — L570 Actinic keratosis: Secondary | ICD-10-CM | POA: Diagnosis not present

## 2019-05-29 DIAGNOSIS — M67833 Other specified disorders of tendon, right wrist: Secondary | ICD-10-CM | POA: Diagnosis not present

## 2019-06-24 ENCOUNTER — Ambulatory Visit: Payer: BLUE CROSS/BLUE SHIELD | Attending: Internal Medicine

## 2019-06-24 DIAGNOSIS — Z20822 Contact with and (suspected) exposure to covid-19: Secondary | ICD-10-CM

## 2019-06-24 DIAGNOSIS — Z20828 Contact with and (suspected) exposure to other viral communicable diseases: Secondary | ICD-10-CM | POA: Diagnosis not present

## 2019-06-26 LAB — NOVEL CORONAVIRUS, NAA: SARS-CoV-2, NAA: NOT DETECTED

## 2019-07-01 ENCOUNTER — Ambulatory Visit: Payer: BLUE CROSS/BLUE SHIELD | Attending: Internal Medicine

## 2019-07-01 DIAGNOSIS — Z20822 Contact with and (suspected) exposure to covid-19: Secondary | ICD-10-CM | POA: Diagnosis not present

## 2019-07-02 LAB — NOVEL CORONAVIRUS, NAA: SARS-CoV-2, NAA: NOT DETECTED

## 2019-11-12 ENCOUNTER — Other Ambulatory Visit: Payer: Self-pay | Admitting: Family Medicine

## 2019-11-12 ENCOUNTER — Other Ambulatory Visit (HOSPITAL_COMMUNITY)
Admission: RE | Admit: 2019-11-12 | Discharge: 2019-11-12 | Disposition: A | Discharge status: Home / Self Care | Payer: BC Managed Care – PPO | Source: Ambulatory Visit | Attending: Family Medicine | Admitting: Family Medicine

## 2019-11-12 DIAGNOSIS — Z0000 Encounter for general adult medical examination without abnormal findings: Secondary | ICD-10-CM | POA: Diagnosis not present

## 2019-11-12 DIAGNOSIS — Z124 Encounter for screening for malignant neoplasm of cervix: Secondary | ICD-10-CM | POA: Insufficient documentation

## 2019-11-12 DIAGNOSIS — Z30011 Encounter for initial prescription of contraceptive pills: Secondary | ICD-10-CM | POA: Diagnosis not present

## 2019-11-12 DIAGNOSIS — E559 Vitamin D deficiency, unspecified: Secondary | ICD-10-CM | POA: Diagnosis not present

## 2019-11-12 DIAGNOSIS — E7800 Pure hypercholesterolemia, unspecified: Secondary | ICD-10-CM | POA: Diagnosis not present

## 2019-11-14 LAB — CYTOLOGY - PAP
Comment: NEGATIVE
Diagnosis: NEGATIVE
High risk HPV: NEGATIVE

## 2020-01-01 DIAGNOSIS — J309 Allergic rhinitis, unspecified: Secondary | ICD-10-CM | POA: Diagnosis not present

## 2020-01-01 DIAGNOSIS — J029 Acute pharyngitis, unspecified: Secondary | ICD-10-CM | POA: Diagnosis not present

## 2020-01-16 DIAGNOSIS — J019 Acute sinusitis, unspecified: Secondary | ICD-10-CM | POA: Diagnosis not present

## 2020-01-17 ENCOUNTER — Other Ambulatory Visit: Payer: Self-pay | Admitting: Family Medicine

## 2020-01-17 DIAGNOSIS — Z1231 Encounter for screening mammogram for malignant neoplasm of breast: Secondary | ICD-10-CM

## 2020-01-22 DIAGNOSIS — M1811 Unilateral primary osteoarthritis of first carpometacarpal joint, right hand: Secondary | ICD-10-CM | POA: Diagnosis not present

## 2020-01-22 DIAGNOSIS — M25531 Pain in right wrist: Secondary | ICD-10-CM | POA: Diagnosis not present

## 2020-01-29 ENCOUNTER — Ambulatory Visit: Payer: BC Managed Care – PPO

## 2020-01-31 ENCOUNTER — Ambulatory Visit: Payer: BC Managed Care – PPO

## 2020-02-12 ENCOUNTER — Ambulatory Visit
Admission: RE | Admit: 2020-02-12 | Discharge: 2020-02-12 | Disposition: A | Discharge status: Home / Self Care | Payer: BC Managed Care – PPO | Source: Ambulatory Visit | Attending: Family Medicine | Admitting: Family Medicine

## 2020-02-12 ENCOUNTER — Other Ambulatory Visit: Payer: Self-pay

## 2020-02-12 DIAGNOSIS — Z1231 Encounter for screening mammogram for malignant neoplasm of breast: Secondary | ICD-10-CM

## 2020-02-14 ENCOUNTER — Other Ambulatory Visit: Payer: Self-pay | Admitting: Family Medicine

## 2020-02-14 DIAGNOSIS — R928 Other abnormal and inconclusive findings on diagnostic imaging of breast: Secondary | ICD-10-CM

## 2020-03-03 DIAGNOSIS — M25512 Pain in left shoulder: Secondary | ICD-10-CM | POA: Diagnosis not present

## 2020-03-03 DIAGNOSIS — M79605 Pain in left leg: Secondary | ICD-10-CM | POA: Diagnosis not present

## 2020-03-05 ENCOUNTER — Other Ambulatory Visit: Payer: Self-pay

## 2020-03-05 ENCOUNTER — Ambulatory Visit
Admission: RE | Admit: 2020-03-05 | Discharge: 2020-03-05 | Disposition: A | Discharge status: Home / Self Care | Payer: BC Managed Care – PPO | Source: Ambulatory Visit | Attending: Family Medicine | Admitting: Family Medicine

## 2020-03-05 DIAGNOSIS — R928 Other abnormal and inconclusive findings on diagnostic imaging of breast: Secondary | ICD-10-CM

## 2020-03-05 DIAGNOSIS — N6489 Other specified disorders of breast: Secondary | ICD-10-CM | POA: Diagnosis not present

## 2020-03-18 DIAGNOSIS — M5416 Radiculopathy, lumbar region: Secondary | ICD-10-CM | POA: Diagnosis not present

## 2020-03-18 DIAGNOSIS — M25512 Pain in left shoulder: Secondary | ICD-10-CM | POA: Diagnosis not present

## 2020-03-25 DIAGNOSIS — M654 Radial styloid tenosynovitis [de Quervain]: Secondary | ICD-10-CM | POA: Diagnosis not present

## 2020-09-11 DIAGNOSIS — H938X1 Other specified disorders of right ear: Secondary | ICD-10-CM | POA: Diagnosis not present

## 2020-09-11 DIAGNOSIS — N898 Other specified noninflammatory disorders of vagina: Secondary | ICD-10-CM | POA: Diagnosis not present

## 2020-09-11 DIAGNOSIS — B351 Tinea unguium: Secondary | ICD-10-CM | POA: Diagnosis not present

## 2020-09-11 DIAGNOSIS — R109 Unspecified abdominal pain: Secondary | ICD-10-CM | POA: Diagnosis not present

## 2020-10-31 IMAGING — MG DIGITAL SCREENING BILAT W/ TOMO W/ CAD
8 series · 8 of 24 positions shown · non-contrast
Comparison: Previous exam(s).

CLINICAL DATA: Screening.

EXAM:
DIGITAL SCREENING BILATERAL MAMMOGRAM WITH TOMO AND CAD

[R MLO synth-2D]
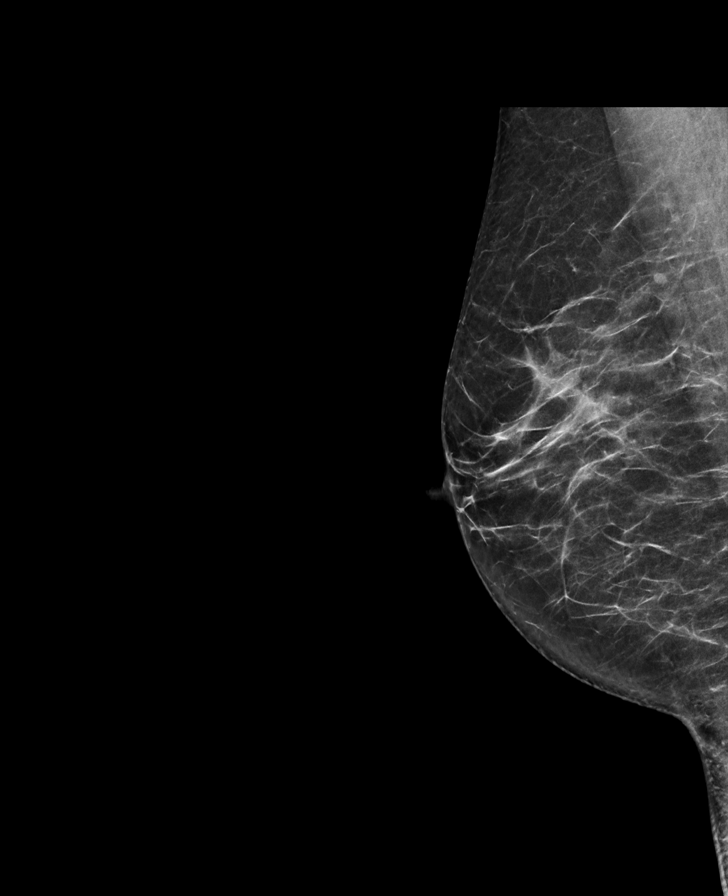

[L MLO synth-2D]
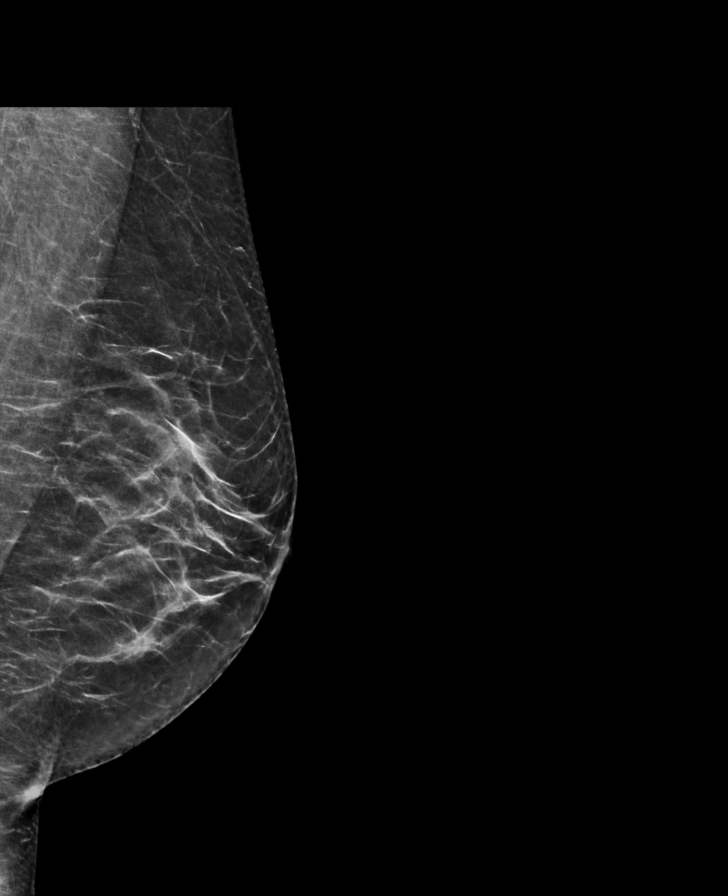

[L CC synth-2D]
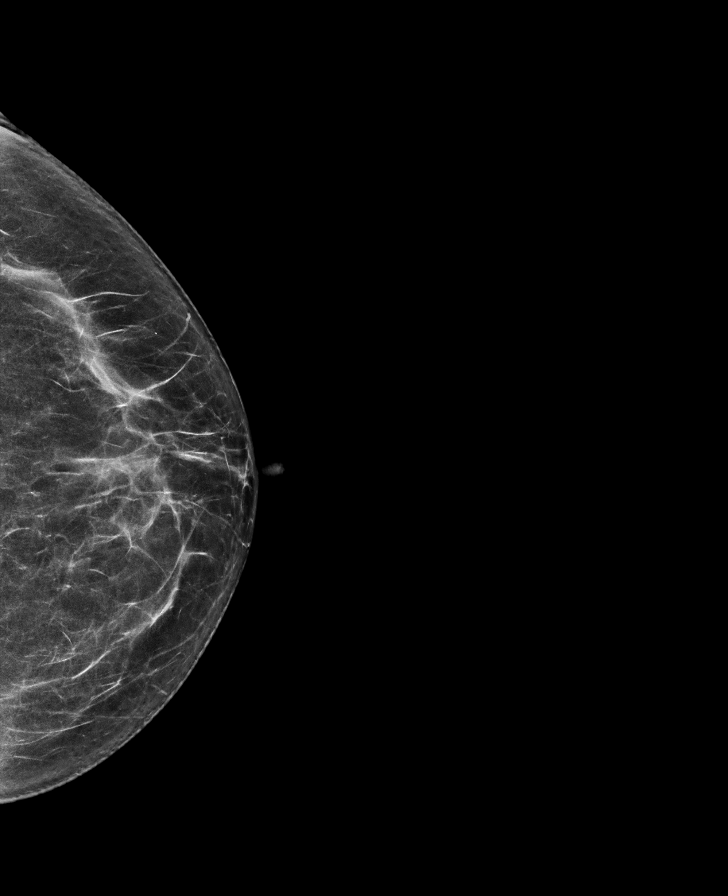

[R CC synth-2D]
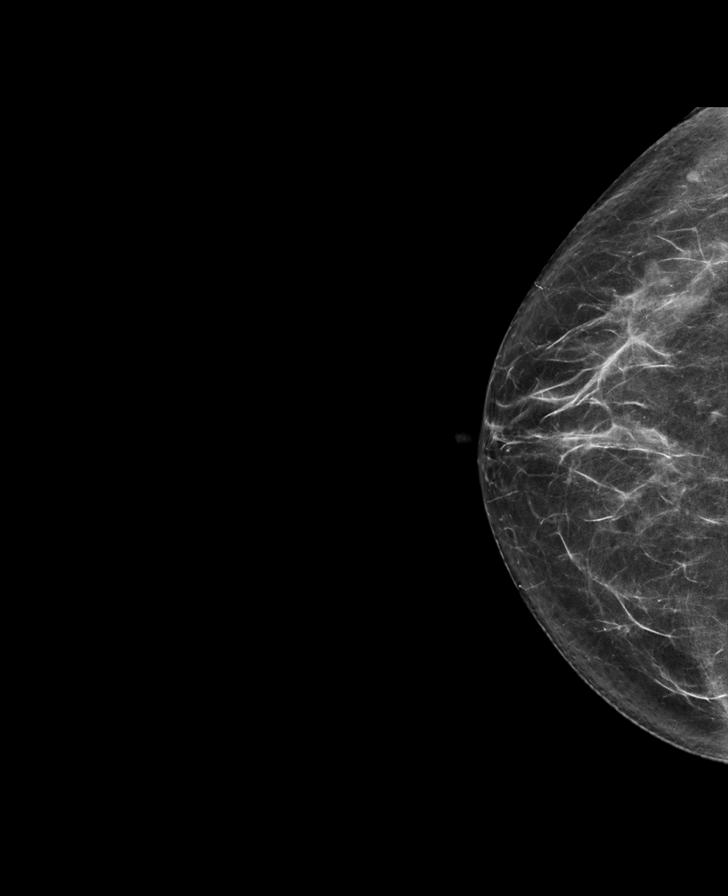

[R CC tomo · tomo slice 36/71.0]
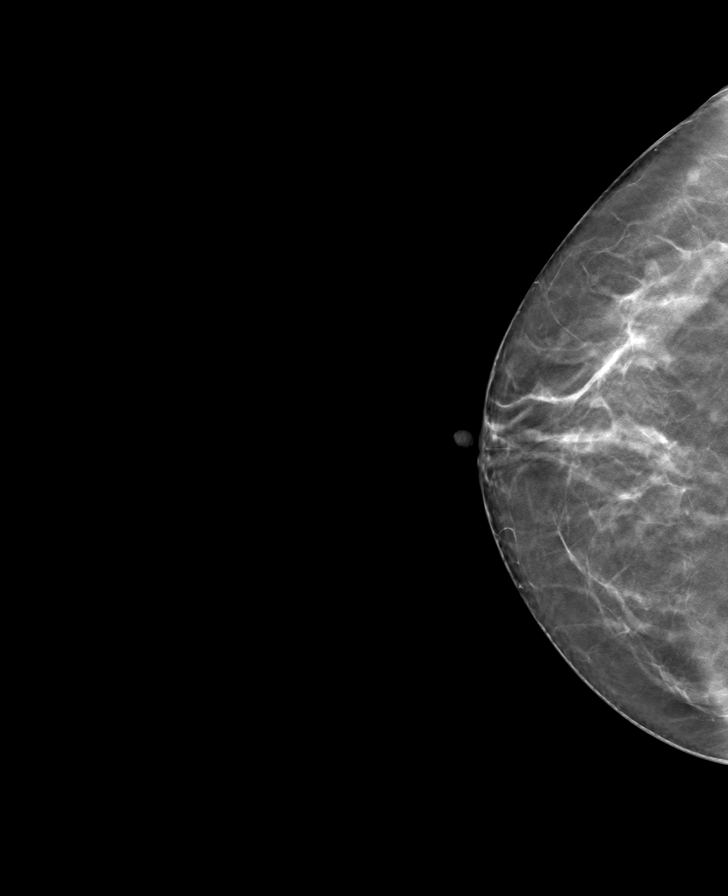

[L MLO tomo · tomo slice 33/65.0]
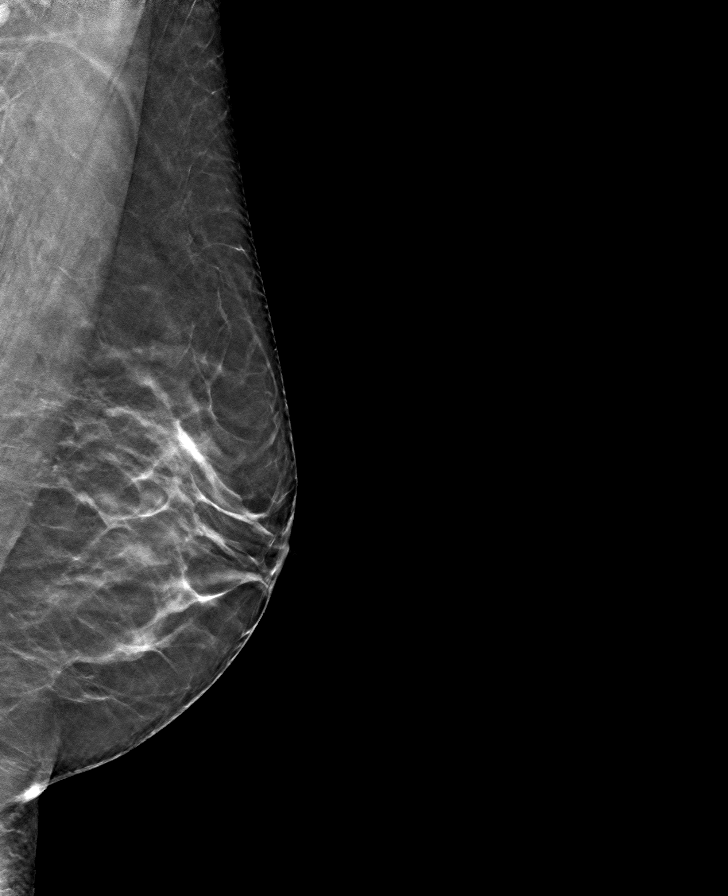

[R MLO tomo · tomo slice 35/68.0]
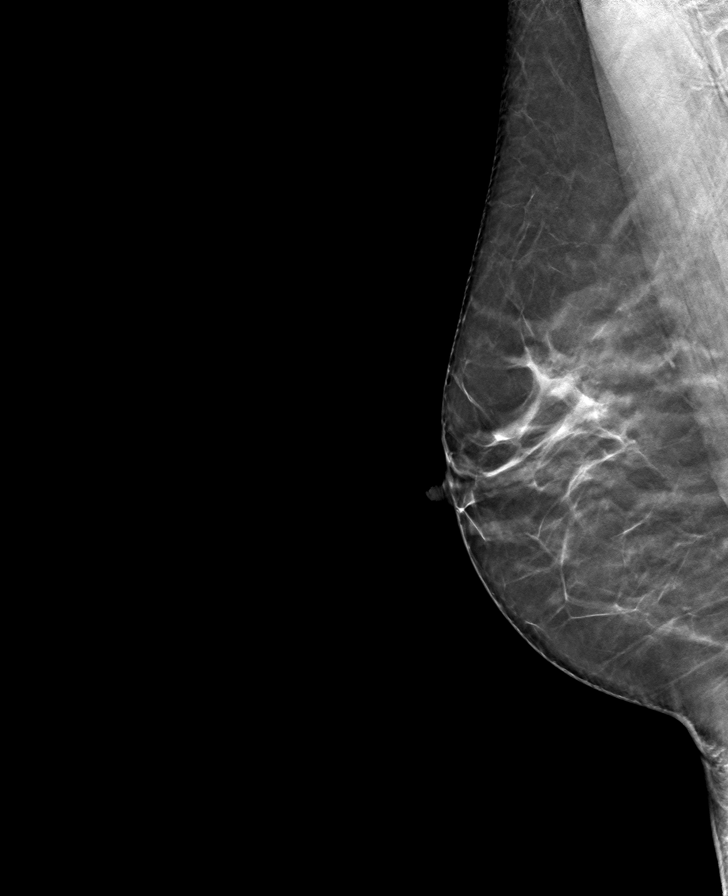

[L CC tomo · tomo slice 35/69.0]
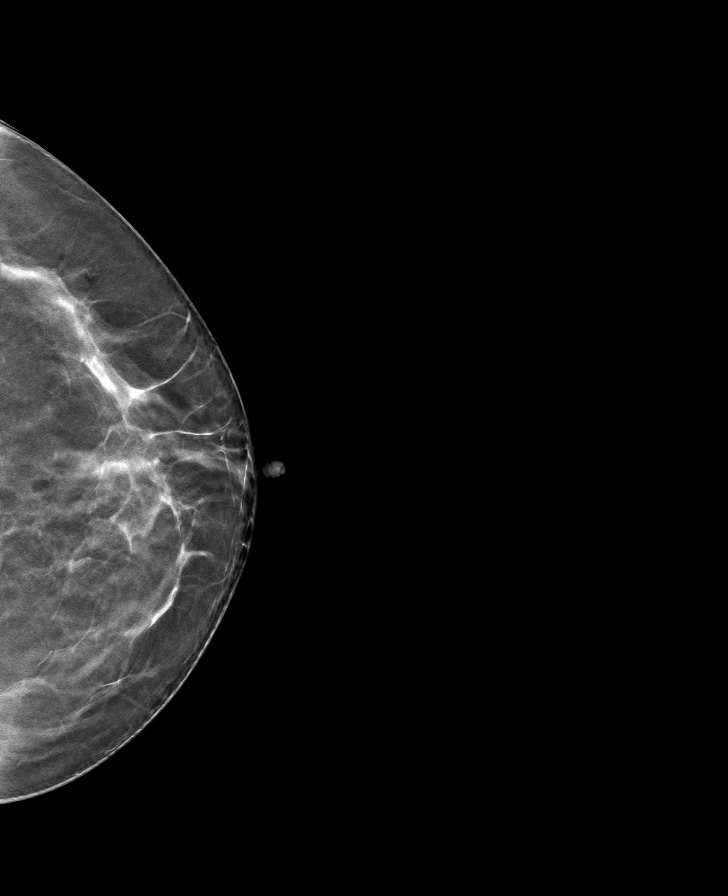

[8 of 24 positions shown; findings below may reference images not displayed]

ACR Breast Density Category b: There are scattered areas of
fibroglandular density.
FINDINGS: In the right breast, a possible asymmetry warrants further
evaluation. In the left breast, no findings suspicious for
malignancy. Images were processed with CAD.
IMPRESSION: Further evaluation is suggested for possible asymmetry in the right
breast.

RECOMMENDATION:
Diagnostic mammogram and possibly ultrasound of the right breast.
(Code:[id number]-55T)

The patient will be contacted regarding the findings, and additional
imaging will be scheduled.

BI-RADS CATEGORY  0: Incomplete. Need additional imaging evaluation
and/or prior mammograms for comparison.

## 2020-12-01 DIAGNOSIS — E7800 Pure hypercholesterolemia, unspecified: Secondary | ICD-10-CM | POA: Diagnosis not present

## 2020-12-01 DIAGNOSIS — E559 Vitamin D deficiency, unspecified: Secondary | ICD-10-CM | POA: Diagnosis not present

## 2020-12-01 DIAGNOSIS — Z0000 Encounter for general adult medical examination without abnormal findings: Secondary | ICD-10-CM | POA: Diagnosis not present

## 2021-02-15 DIAGNOSIS — R519 Headache, unspecified: Secondary | ICD-10-CM | POA: Diagnosis not present

## 2021-06-03 DIAGNOSIS — M79605 Pain in left leg: Secondary | ICD-10-CM | POA: Diagnosis not present

## 2021-06-03 DIAGNOSIS — M545 Low back pain, unspecified: Secondary | ICD-10-CM | POA: Diagnosis not present

## 2021-09-02 DIAGNOSIS — M5459 Other low back pain: Secondary | ICD-10-CM | POA: Diagnosis not present

## 2021-09-07 DIAGNOSIS — M5459 Other low back pain: Secondary | ICD-10-CM | POA: Diagnosis not present

## 2021-09-09 DIAGNOSIS — M5459 Other low back pain: Secondary | ICD-10-CM | POA: Diagnosis not present

## 2021-09-20 DIAGNOSIS — M5459 Other low back pain: Secondary | ICD-10-CM | POA: Diagnosis not present

## 2021-09-21 DIAGNOSIS — M5459 Other low back pain: Secondary | ICD-10-CM | POA: Diagnosis not present

## 2021-09-28 DIAGNOSIS — M5459 Other low back pain: Secondary | ICD-10-CM | POA: Diagnosis not present

## 2021-09-30 DIAGNOSIS — M5459 Other low back pain: Secondary | ICD-10-CM | POA: Diagnosis not present

## 2021-10-05 DIAGNOSIS — M5459 Other low back pain: Secondary | ICD-10-CM | POA: Diagnosis not present

## 2021-11-11 DIAGNOSIS — M9904 Segmental and somatic dysfunction of sacral region: Secondary | ICD-10-CM | POA: Diagnosis not present

## 2021-11-11 DIAGNOSIS — M5417 Radiculopathy, lumbosacral region: Secondary | ICD-10-CM | POA: Diagnosis not present

## 2021-11-11 DIAGNOSIS — M9902 Segmental and somatic dysfunction of thoracic region: Secondary | ICD-10-CM | POA: Diagnosis not present

## 2021-11-11 DIAGNOSIS — M9903 Segmental and somatic dysfunction of lumbar region: Secondary | ICD-10-CM | POA: Diagnosis not present

## 2021-11-16 DIAGNOSIS — M5417 Radiculopathy, lumbosacral region: Secondary | ICD-10-CM | POA: Diagnosis not present

## 2021-11-16 DIAGNOSIS — M9903 Segmental and somatic dysfunction of lumbar region: Secondary | ICD-10-CM | POA: Diagnosis not present

## 2021-11-16 DIAGNOSIS — M9902 Segmental and somatic dysfunction of thoracic region: Secondary | ICD-10-CM | POA: Diagnosis not present

## 2021-11-16 DIAGNOSIS — M9904 Segmental and somatic dysfunction of sacral region: Secondary | ICD-10-CM | POA: Diagnosis not present

## 2021-11-19 DIAGNOSIS — M9902 Segmental and somatic dysfunction of thoracic region: Secondary | ICD-10-CM | POA: Diagnosis not present

## 2021-11-19 DIAGNOSIS — M5417 Radiculopathy, lumbosacral region: Secondary | ICD-10-CM | POA: Diagnosis not present

## 2021-11-19 DIAGNOSIS — M9903 Segmental and somatic dysfunction of lumbar region: Secondary | ICD-10-CM | POA: Diagnosis not present

## 2021-11-19 DIAGNOSIS — M9904 Segmental and somatic dysfunction of sacral region: Secondary | ICD-10-CM | POA: Diagnosis not present

## 2021-11-22 DIAGNOSIS — R21 Rash and other nonspecific skin eruption: Secondary | ICD-10-CM | POA: Diagnosis not present

## 2021-11-26 DIAGNOSIS — M5417 Radiculopathy, lumbosacral region: Secondary | ICD-10-CM | POA: Diagnosis not present

## 2021-11-26 DIAGNOSIS — M9902 Segmental and somatic dysfunction of thoracic region: Secondary | ICD-10-CM | POA: Diagnosis not present

## 2021-11-26 DIAGNOSIS — M9904 Segmental and somatic dysfunction of sacral region: Secondary | ICD-10-CM | POA: Diagnosis not present

## 2021-11-26 DIAGNOSIS — M9903 Segmental and somatic dysfunction of lumbar region: Secondary | ICD-10-CM | POA: Diagnosis not present

## 2021-11-30 DIAGNOSIS — M9904 Segmental and somatic dysfunction of sacral region: Secondary | ICD-10-CM | POA: Diagnosis not present

## 2021-11-30 DIAGNOSIS — M9902 Segmental and somatic dysfunction of thoracic region: Secondary | ICD-10-CM | POA: Diagnosis not present

## 2021-11-30 DIAGNOSIS — M9903 Segmental and somatic dysfunction of lumbar region: Secondary | ICD-10-CM | POA: Diagnosis not present

## 2021-11-30 DIAGNOSIS — M5417 Radiculopathy, lumbosacral region: Secondary | ICD-10-CM | POA: Diagnosis not present

## 2021-12-08 DIAGNOSIS — M9903 Segmental and somatic dysfunction of lumbar region: Secondary | ICD-10-CM | POA: Diagnosis not present

## 2021-12-08 DIAGNOSIS — M9902 Segmental and somatic dysfunction of thoracic region: Secondary | ICD-10-CM | POA: Diagnosis not present

## 2021-12-08 DIAGNOSIS — M9904 Segmental and somatic dysfunction of sacral region: Secondary | ICD-10-CM | POA: Diagnosis not present

## 2021-12-08 DIAGNOSIS — M5417 Radiculopathy, lumbosacral region: Secondary | ICD-10-CM | POA: Diagnosis not present

## 2022-01-24 DIAGNOSIS — Z0000 Encounter for general adult medical examination without abnormal findings: Secondary | ICD-10-CM | POA: Diagnosis not present

## 2022-01-24 DIAGNOSIS — E7800 Pure hypercholesterolemia, unspecified: Secondary | ICD-10-CM | POA: Diagnosis not present

## 2022-01-24 DIAGNOSIS — E559 Vitamin D deficiency, unspecified: Secondary | ICD-10-CM | POA: Diagnosis not present

## 2022-01-24 DIAGNOSIS — Z1211 Encounter for screening for malignant neoplasm of colon: Secondary | ICD-10-CM | POA: Diagnosis not present

## 2022-01-24 DIAGNOSIS — N924 Excessive bleeding in the premenopausal period: Secondary | ICD-10-CM | POA: Diagnosis not present

## 2022-03-02 DIAGNOSIS — M9903 Segmental and somatic dysfunction of lumbar region: Secondary | ICD-10-CM | POA: Diagnosis not present

## 2022-03-02 DIAGNOSIS — M9904 Segmental and somatic dysfunction of sacral region: Secondary | ICD-10-CM | POA: Diagnosis not present

## 2022-03-02 DIAGNOSIS — M9902 Segmental and somatic dysfunction of thoracic region: Secondary | ICD-10-CM | POA: Diagnosis not present

## 2022-03-02 DIAGNOSIS — M5417 Radiculopathy, lumbosacral region: Secondary | ICD-10-CM | POA: Diagnosis not present

## 2022-09-28 ENCOUNTER — Ambulatory Visit: Payer: 59 | Attending: Physician Assistant

## 2022-09-28 ENCOUNTER — Other Ambulatory Visit: Payer: Self-pay

## 2022-09-28 DIAGNOSIS — M6281 Muscle weakness (generalized): Secondary | ICD-10-CM | POA: Diagnosis present

## 2022-09-28 DIAGNOSIS — M5432 Sciatica, left side: Secondary | ICD-10-CM

## 2022-09-28 DIAGNOSIS — R262 Difficulty in walking, not elsewhere classified: Secondary | ICD-10-CM | POA: Insufficient documentation

## 2022-09-28 DIAGNOSIS — R252 Cramp and spasm: Secondary | ICD-10-CM | POA: Diagnosis not present

## 2022-09-28 NOTE — Therapy (Signed)
OUTPATIENT PHYSICAL THERAPY THORACOLUMBAR EVALUATION   Patient Name: Suzanne Webster MRN: [id number] DOB:1970/11/02, 52 y.o., female Today's Date: 09/28/2022  END OF SESSION:  PT End of Session - 09/28/22 0856     Visit Number 1    Date for PT Re-Evaluation 11/23/22    Authorization Type [employer]    PT Start Time 0850    PT Stop Time 0935    PT Time Calculation (min) 45 min    Activity Tolerance Patient tolerated treatment well    Behavior During Therapy [facility] for tasks assessed/performed             History reviewed. No pertinent past medical history. History reviewed. No pertinent surgical history. There are no problems to display for this patient.   PCP: Antony Contras, MD   REFERRING PROVIDER: Traci Sermon, PA-C   REFERRING DIAG: [phone] (ICD-10-CM) - Sciatica, left side  Rationale for Evaluation and Treatment: Rehabilitation  THERAPY DIAG:  Cramp and spasm - Plan: PT plan of care cert/re-cert  Sciatica, left side - Plan: PT plan of care cert/re-cert  Difficulty in walking, not elsewhere classified - Plan: PT plan of care cert/re-cert  Muscle weakness (generalized) - Plan: PT plan of care cert/re-cert  ONSET DATE: 0000000  SUBJECTIVE:                                                                                                                                                                                           SUBJECTIVE STATEMENT: Patient reports left side sciatica pain.  She explains this is mild and had a friend who is going through treatment who suggested dry needling.  She wants to do just one appt for dry needling.  She works in [place] and explains she will have a difficult time getting to appts so she was hoping the dry needling would work.    PERTINENT HISTORY:  na  PAIN:  Are you having pain?  Mild at the moment, increases with sitting for long periods of time  PRECAUTIONS: None  WEIGHT BEARING RESTRICTIONS: No  FALLS:  Has patient  fallen in last 6 months? No No   OCCUPATION: Works at [employer], sitting most of the day  PLOF: Independent, Independent with basic ADLs, Independent with household mobility without device, Independent with community mobility without device, Independent with homemaking with ambulation, Independent with gait, Independent with transfers, Vocation/Vocational requirements: sitting at a desk, and Leisure: generally active  PATIENT GOALS: to get rid of the leg pain  NEXT MD VISIT: prn  OBJECTIVE:   DIAGNOSTIC FINDINGS:  na  PATIENT SURVEYS:  ODI: complete next visit  SCREENING FOR RED FLAGS:  Bowel or bladder incontinence: No Spinal tumors: No Cauda equina syndrome: No Compression fracture: No Abdominal aneurysm: No  COGNITION: Overall cognitive status: Within functional limits for tasks assessed     SENSATION: Radicular symptoms into left LE intermittently  MUSCLE LENGTH: Hamstrings: Right 65 deg; Left 65 deg Thomas test: Right pos ; Left pos   POSTURE: No Significant postural limitations  PALPATION: na  LUMBAR ROM:   AROM eval  Flexion WNL  Extension WNL  Right lateral flexion WNL  Left lateral flexion WNL  Right rotation WNL  Left rotation WNL   (Blank rows = not tested)  LOWER EXTREMITY ROM:     All WNL  LOWER EXTREMITY MMT:    All generally 4 to 4+/5  LUMBAR SPECIAL TESTS:  Straight leg raise test: Positive  FUNCTIONAL TESTS:  5 times sit to stand: complete next visit Timed up and go (TUG): complete next visit  GAIT: Distance walked: 50 Assistive device utilized: None Level of assistance: Complete Independence Comments: normal  TODAY'S TREATMENT:                                                                                                                              DATE: 09/28/22 Initial eval completed and initiated HEP     PATIENT EDUCATION:  Education details: Initiated HEP Person educated: Patient Education method: Consulting civil engineer,  Media planner, Verbal cues, and Handouts Education comprehension: verbalized understanding, returned demonstration, and verbal cues required  HOME EXERCISE PROGRAM: Access Code: [email or web address] URL: [email or web address] Date: 09/28/2022 Prepared by: Candyce Churn  Exercises - Standing Hamstring Stretch on Chair  - 1 x daily - 7 x weekly - 1 sets - 3 reps - 30 sec hold - Standing Quad Stretch with Table and Chair Support  - 1 x daily - 7 x weekly - 1 sets - 3 reps - 30 sec hold - Supine Piriformis Stretch  - 1 x daily - 7 x weekly - 1 sets - 3 reps - 30 sec hold - Seated Figure 4 Piriformis Stretch  - 1 x daily - 7 x weekly - 1 sets - 3 reps - 30 sec hold  ASSESSMENT:  CLINICAL IMPRESSION: Patient is a 52 y.o. female who was seen today for physical therapy evaluation and treatment for left side sciatica. She presents with good lumbar ROM but tightness in bilateral hamstrings and hip flexors along with tight left piriformis.  She also has positive SLR and neural tension.  She would respond well to LE flexibility and core strengthening along with DN to address muscle tension and trigger points.    OBJECTIVE IMPAIRMENTS: difficulty walking, decreased strength, increased fascial restrictions, increased muscle spasms, impaired flexibility, and pain.   ACTIVITY LIMITATIONS: carrying, lifting, bending, sitting, standing, squatting, transfers, and dressing  PARTICIPATION LIMITATIONS: meal prep, cleaning, laundry, shopping, community activity, occupation, yard work, and church  PERSONAL FACTORS: Pension scheme manager are also affecting patient's functional outcome.   REHAB POTENTIAL: Good  CLINICAL  DECISION MAKING: Stable/uncomplicated  EVALUATION COMPLEXITY: Low   GOALS: Goals reviewed with patient? Yes  SHORT TERM GOALS: Target date: 10/26/2022   Pain report to be no greater than 4/10  Baseline: Goal status: INITIAL  2.  Patient will be independent with initial HEP   Baseline:  Goal status: INITIAL   LONG TERM GOALS: Target date: 11/23/2022   Patient to report pain no greater than 2/10  Baseline:  Goal status: INITIAL  2.  Patient to be independent with advanced HEP  Baseline:  Goal status: INITIAL  3.  ODI to improve by 4 points Baseline:  Goal status: INITIAL  4.  Patient to report 85% improvement in overall symptoms Baseline:  Goal status: INITIAL  5.  Functional tests to improve by 1-2 seconds Baseline:  Goal status: INITIAL   PLAN:  PT FREQUENCY: 1-2x/week  PT DURATION: 8 weeks  PLANNED INTERVENTIONS: Therapeutic exercises, Therapeutic activity, Neuromuscular re-education, Balance training, Patient/Family education, Self Care, Joint mobilization, Aquatic Therapy, Dry Needling, Electrical stimulation, Spinal mobilization, Cryotherapy, Moist heat, Taping, Traction, Ultrasound, Manual therapy, and Re-evaluation.  PLAN FOR NEXT SESSION: Complete ODI, 5 times sit to stand and TUG, Dry needling, review HEP.    Anderson Malta B. Rishawn Walck, PT 09/28/22 5:17 PM [place] [address], [place], [place] [address] Phone # [phone] Fax [phone]

## 2022-10-12 ENCOUNTER — Ambulatory Visit: Payer: 59

## 2022-10-12 DIAGNOSIS — R252 Cramp and spasm: Secondary | ICD-10-CM

## 2022-10-12 DIAGNOSIS — M6281 Muscle weakness (generalized): Secondary | ICD-10-CM

## 2022-10-12 DIAGNOSIS — R262 Difficulty in walking, not elsewhere classified: Secondary | ICD-10-CM

## 2022-10-12 DIAGNOSIS — M5432 Sciatica, left side: Secondary | ICD-10-CM

## 2022-10-12 NOTE — Therapy (Addendum)
OUTPATIENT PHYSICAL THERAPY Treatment   Patient Name: Suzanne Webster MRN: [id number] DOB:1971/06/25, 52 y.o., female Today's Date: 10/12/2022  END OF SESSION:  PT End of Session - 10/12/22 1612     Visit Number 2    Date for PT Re-Evaluation 11/23/22    Authorization Type UHC    PT Start Time 1535    PT Stop Time 1607    PT Time Calculation (min) 32 min    Activity Tolerance Patient tolerated treatment well    Behavior During Therapy [facility] for tasks assessed/performed              History reviewed. No pertinent past medical history. History reviewed. No pertinent surgical history. There are no problems to display for this patient.   PCP: Tally Joe, MD   REFERRING PROVIDER: Alyson Ingles, PA-C   REFERRING DIAG: [phone] (ICD-10-CM) - Sciatica, left side  Rationale for Evaluation and Treatment: Rehabilitation  THERAPY DIAG:  Sciatica, left side  Cramp and spasm  Difficulty in walking, not elsewhere classified  Muscle weakness (generalized)  ONSET DATE: 09/22/2022  SUBJECTIVE:                                                                                                                                                                                           SUBJECTIVE STATEMENT: The exercises have really been helping my pain more that what I was doing. I am 40% better since I started doing them.   PERTINENT HISTORY:  na  PAIN:  Are you having pain?  Mild at the moment, increases with sitting for long periods of time Lt gluteals: 8/10 briefly with walking the dog, it goes away when I stretch  PRECAUTIONS: None  WEIGHT BEARING RESTRICTIONS: No  FALLS:  Has patient fallen in last 6 months? No No   OCCUPATION: Works at [employer], sitting most of the day  PLOF: Independent, Independent with basic ADLs, Independent with household mobility without device, Independent with community mobility without device, Independent with homemaking with ambulation,  Independent with gait, Independent with transfers, Vocation/Vocational requirements: sitting at a desk, and Leisure: generally active  PATIENT GOALS: to get rid of the leg pain  NEXT MD VISIT: prn  OBJECTIVE:   DIAGNOSTIC FINDINGS:  na  PATIENT SURVEYS:  ODI: complete next visit  SCREENING FOR RED FLAGS: Bowel or bladder incontinence: No Spinal tumors: No Cauda equina syndrome: No Compression fracture: No Abdominal aneurysm: No  COGNITION: Overall cognitive status: Within functional limits for tasks assessed     SENSATION: Radicular symptoms into left LE intermittently  MUSCLE LENGTH: Hamstrings: Right 65 deg; Left 65 deg Thomas test: Right  pos ; Left pos   POSTURE: No Significant postural limitations  PALPATION: na  LUMBAR ROM:   AROM eval  Flexion WNL  Extension WNL  Right lateral flexion WNL  Left lateral flexion WNL  Right rotation WNL  Left rotation WNL   (Blank rows = not tested)  LOWER EXTREMITY ROM:     All WNL  LOWER EXTREMITY MMT:    All generally 4 to 4+/5  LUMBAR SPECIAL TESTS:  Straight leg raise test: Positive  FUNCTIONAL TESTS:  5 times sit to stand: complete next visit Timed up and go (TUG): complete next visit  GAIT: Distance walked: 50 Assistive device utilized: None Level of assistance: Complete Independence Comments: normal  TODAY'S TREATMENT:     DATE: 10/12/22 Review of all HEP- good demo of all, offered modified positions for quad and hamstring stretch so can be done more easily at work.  Trigger Point Dry-Needling  Treatment instructions: Expect mild to moderate muscle soreness. S/S of pneumothorax if dry needled over a lung field, and to seek immediate medical attention should they occur. Patient verbalized understanding of these instructions and education.  Patient Consent Given: Yes Education handout provided: Previously provided Muscles treated: Bil lumbar multifidi and Lt gluteals  Treatment response/outcome:  Utilized skilled palpation to identify trigger points.  During dry needling able to palpate muscle twitch and muscle elongation  Elongation after DN Skilled palpation and monitoring by PT during dry needling                                                                                                                           DATE: 09/28/22 Initial eval completed and initiated HEP     PATIENT EDUCATION:  Education details: Initiated HEP Person educated: Patient Education method: Programmer, multimedia, Facilities manager, Verbal cues, and Handouts Education comprehension: verbalized understanding, returned demonstration, and verbal cues required  HOME EXERCISE PROGRAM: Access Code: [email or web address] URL: [email or web address] Date: 09/28/2022 Prepared by: Mikey Kirschner  Exercises - Standing Hamstring Stretch on Chair  - 1 x daily - 7 x weekly - 1 sets - 3 reps - 30 sec hold - Standing Quad Stretch with Table and Chair Support  - 1 x daily - 7 x weekly - 1 sets - 3 reps - 30 sec hold - Supine Piriformis Stretch  - 1 x daily - 7 x weekly - 1 sets - 3 reps - 30 sec hold - Seated Figure 4 Piriformis Stretch  - 1 x daily - 7 x weekly - 1 sets - 3 reps - 30 sec hold  ASSESSMENT:  CLINICAL IMPRESSION: First time follow-up after evaluation.  Pt reports 40% reduction in pain after starting new stretches.  Pt demonstrated all aspects of exercises correctly.  Pt tolerated DN to multifidi and Lt gluteals with improved tissue mobility after session.  She was anxious during treatment and was monitored for symptoms.  She would respond well to LE flexibility and core strengthening along with DN to address muscle tension  and trigger points.    OBJECTIVE IMPAIRMENTS: difficulty walking, decreased strength, increased fascial restrictions, increased muscle spasms, impaired flexibility, and pain.   ACTIVITY LIMITATIONS: carrying, lifting, bending, sitting, standing, squatting, transfers, and  dressing  PARTICIPATION LIMITATIONS: meal prep, cleaning, laundry, shopping, community activity, occupation, yard work, and church  PERSONAL FACTORS: Careers information officer are also affecting patient's functional outcome.   REHAB POTENTIAL: Good  CLINICAL DECISION MAKING: Stable/uncomplicated  EVALUATION COMPLEXITY: Low   GOALS: Goals reviewed with patient? Yes  SHORT TERM GOALS: Target date: 10/26/2022   Pain report to be no greater than 4/10  Baseline: Goal status: INITIAL  2.  Patient will be independent with initial HEP  Baseline:  Goal status: INITIAL   LONG TERM GOALS: Target date: 11/23/2022   Patient to report pain no greater than 2/10  Baseline:  Goal status: INITIAL  2.  Patient to be independent with advanced HEP  Baseline:  Goal status: INITIAL  3.  ODI to improve by 4 points Baseline:  Goal status: INITIAL  4.  Patient to report 85% improvement in overall symptoms Baseline:  Goal status: INITIAL  5.  Functional tests to improve by 1-2 seconds Baseline:  Goal status: INITIAL   PLAN:  PT FREQUENCY: 1-2x/week  PT DURATION: 8 weeks  PLANNED INTERVENTIONS: Therapeutic exercises, Therapeutic activity, Neuromuscular re-education, Balance training, Patient/Family education, Self Care, Joint mobilization, Aquatic Therapy, Dry Needling, Electrical stimulation, Spinal mobilization, Cryotherapy, Moist heat, Taping, Traction, Ultrasound, Manual therapy, and Re-evaluation.  PLAN FOR NEXT SESSION: add core and gluteal strength to HEP, DN again if tolerated and helpful   Lorrene Reid, PT 10/12/22 4:17 PM   PHYSICAL THERAPY DISCHARGE SUMMARY  Visits from Start of Care: 2  Current functional level related to goals / functional outcomes: See above for current status.    Remaining deficits: See above for current PT status.     Education / Equipment: HEP   Patient agrees to discharge. Patient goals were not met. Patient is being discharged due to  not returning since the last visit.  Lorrene Reid, PT 07/27/23 7:43 AM   [facility] [address], [address] [place], [place] [address] Phone # [phone] Fax [phone]

## 2022-10-19 ENCOUNTER — Ambulatory Visit: Payer: 59

## 2023-03-30 ENCOUNTER — Ambulatory Visit: Payer: Self-pay | Admitting: Allergy & Immunology

## 2023-03-30 ENCOUNTER — Other Ambulatory Visit: Payer: Self-pay | Admitting: Nurse Practitioner

## 2023-03-30 DIAGNOSIS — Z1231 Encounter for screening mammogram for malignant neoplasm of breast: Secondary | ICD-10-CM

## 2023-04-19 ENCOUNTER — Ambulatory Visit: Payer: 59

## 2023-04-25 ENCOUNTER — Ambulatory Visit
Admission: RE | Admit: 2023-04-25 | Discharge: 2023-04-25 | Disposition: A | Discharge status: Home / Self Care | Payer: 59 | Source: Ambulatory Visit | Attending: Nurse Practitioner | Admitting: Nurse Practitioner

## 2023-04-25 DIAGNOSIS — Z1231 Encounter for screening mammogram for malignant neoplasm of breast: Secondary | ICD-10-CM

## 2023-06-16 ENCOUNTER — Encounter: Payer: Self-pay | Admitting: Pulmonary Disease

## 2023-06-16 ENCOUNTER — Institutional Professional Consult (permissible substitution): Payer: 59 | Admitting: Pulmonary Disease

## 2023-12-27 ENCOUNTER — Other Ambulatory Visit: Payer: Self-pay | Admitting: Family Medicine

## 2023-12-27 DIAGNOSIS — N939 Abnormal uterine and vaginal bleeding, unspecified: Secondary | ICD-10-CM

## 2024-01-19 ENCOUNTER — Encounter: Payer: Self-pay | Admitting: Hematology and Oncology

## 2024-01-19 ENCOUNTER — Inpatient Hospital Stay: Attending: Hematology and Oncology | Admitting: Hematology and Oncology

## 2024-01-19 ENCOUNTER — Inpatient Hospital Stay

## 2024-01-19 VITALS — BP 126/82 | HR 83 | Temp 98.6°F | Resp 18 | Ht 67.0 in | Wt 157.4 lb

## 2024-01-19 DIAGNOSIS — E7800 Pure hypercholesterolemia, unspecified: Secondary | ICD-10-CM | POA: Insufficient documentation

## 2024-01-19 DIAGNOSIS — D72820 Lymphocytosis (symptomatic): Secondary | ICD-10-CM | POA: Diagnosis present

## 2024-01-19 DIAGNOSIS — F419 Anxiety disorder, unspecified: Secondary | ICD-10-CM | POA: Insufficient documentation

## 2024-01-19 DIAGNOSIS — E282 Polycystic ovarian syndrome: Secondary | ICD-10-CM | POA: Insufficient documentation

## 2024-01-19 DIAGNOSIS — N924 Excessive bleeding in the premenopausal period: Secondary | ICD-10-CM | POA: Insufficient documentation

## 2024-01-19 DIAGNOSIS — J309 Allergic rhinitis, unspecified: Secondary | ICD-10-CM | POA: Insufficient documentation

## 2024-01-19 DIAGNOSIS — E559 Vitamin D deficiency, unspecified: Secondary | ICD-10-CM | POA: Insufficient documentation

## 2024-01-19 DIAGNOSIS — K5792 Diverticulitis of intestine, part unspecified, without perforation or abscess without bleeding: Secondary | ICD-10-CM | POA: Insufficient documentation

## 2024-01-19 DIAGNOSIS — K5732 Diverticulitis of large intestine without perforation or abscess without bleeding: Secondary | ICD-10-CM | POA: Insufficient documentation

## 2024-01-19 DIAGNOSIS — C9110 Chronic lymphocytic leukemia of B-cell type not having achieved remission: Secondary | ICD-10-CM | POA: Insufficient documentation

## 2024-01-19 NOTE — Assessment & Plan Note (Signed)
 It is possible the lymphocytosis could be reactive to her illness/infection Since the patient is still receiving antibiotics, I recommend we defer lab evaluation until she complete her course of antibiotics I will see her within 3 days after lab draws to discuss test results I will order repeat CBC with differential, sed rate and flow cytometry If her test came back normal after her antibiotic treatment, she would not need follow-up On the other hand, if flow cytometry detected abnormal clone, we will pursue additional workup

## 2024-01-19 NOTE — Progress Notes (Signed)
 [facility] CONSULT NOTE  Patient Care Team: Seabron Lenis, MD as PCP - General (Family Medicine)   ASSESSMENT & PLAN  Lymphocytosis It is possible the lymphocytosis could be reactive to her illness/infection Since the patient is still receiving antibiotics, I recommend we defer lab evaluation until she complete her course of antibiotics I will see her within 3 days after lab draws to discuss test results I will order repeat CBC with differential, sed rate and flow cytometry If her test came back normal after her antibiotic treatment, she would not need follow-up On the other hand, if flow cytometry detected abnormal clone, we will pursue additional workup  Orders Placed This Encounter  Procedures   CBC with Differential (Cancer Center Only)    Standing Status:   Future    Expiration Date:   01/18/2025   Sedimentation rate    Standing Status:   Future    Expiration Date:   01/18/2025   Flow Cytometry, Peripheral Blood (Oncology)    Flow lymphoma    Standing Status:   Future    Expiration Date:   01/18/2025    All questions were answered. The patient knows to call the clinic with any problems, questions or concerns. I spent 55 minutes counseling the patient face to face, counseling and review of plan of care.   Almarie Bedford, MD 01/19/2024 2:00 PM   CHIEF COMPLAINTS/PURPOSE OF CONSULTATION:  Lymphocytosis  HISTORY OF PRESENTING ILLNESS:  Suzanne Webster 53 y.o. female is here because of elevated lymphocyte count. She is here accompanied by her husband The patient has chronic sinus congestion/rhinitis on background history of asthma She also has history of intermittent flare of diverticulosis/diverticulitis She is currently on a course of antibiotics, day 4 for diverticulitis Reviewed her electronic records On March 16, 2012, her white blood cell count is normal at 6.3 with normal differential On September 21, 2018, her white blood cell count is normal at 8.3 with  normal differential From her physician's office, on December 27, 2023, total white count was 10.2 with abnormal differential.  She had 70.8% lymphocyte count, 23.1 neutrophil percentage, giving absolute lymphocyte count of 7.2  There is not reported symptoms of sinus congestion, cough, urinary frequency/urgency or dysuria, joint swelling/pain or abnormal skin rash.  She had no prior history or diagnosis of cancer. Her age appropriate screening programs are up-to-date. The patient has no prior diagnosis of autoimmune disease and was not prescribed corticosteroids related products. No recent lymphadenopathy or abnormal weight changes  MEDICAL HISTORY:  Past Medical History:  Diagnosis Date   Abnormal perimenopausal bleeding 01/19/2024   Acute diverticulitis 01/19/2024   Allergic rhinitis 01/19/2024   Allergy    Anxiety 01/19/2024   Asthma    Diverticulitis of colon 01/19/2024   Elevated LDL cholesterol level 01/19/2024   Lymphocytosis 01/19/2024   Polycystic ovarian syndrome 01/19/2024   Vitamin D deficiency 01/19/2024    SURGICAL HISTORY: History reviewed. No pertinent surgical history.  SOCIAL HISTORY: Social History   Socioeconomic History   Marital status: Divorced    Spouse name: Not on file   Number of children: Not on file   Years of education: Not on file   Highest education level: Not on file  Occupational History   Not on file  Tobacco Use   Smoking status: Never   Smokeless tobacco: Not on file  Substance and Sexual Activity   Alcohol use: No   Drug use: No   Sexual activity: Not on file  Other Topics Concern   Not on file  Social History Narrative   Not on file   Social Drivers of Health   Financial Resource Strain: Not on file  Food Insecurity: Food Insecurity Present (01/19/2024)   Hunger Vital Sign    Worried About Running Out of Food in the Last Year: Sometimes true    Ran Out of Food in the Last Year: Sometimes true  Transportation Needs: No  Transportation Needs (01/19/2024)   PRAPARE - Administrator, Civil Service (Medical): No    Lack of Transportation (Non-Medical): No  Physical Activity: Not on file  Stress: Not on file  Social Connections: Not on file  Intimate Partner Violence: Not At Risk (01/19/2024)   Humiliation, Afraid, Rape, and Kick questionnaire    Fear of Current or Ex-Partner: No    Emotionally Abused: No    Physically Abused: No    Sexually Abused: No    FAMILY HISTORY: History reviewed. No pertinent family history.  ALLERGIES:  is allergic to prednisone and atorvastatin calcium.  MEDICATIONS:  Current Outpatient Medications  Medication Sig Dispense Refill   albuterol (VENTOLIN HFA) 108 (90 Base) MCG/ACT inhaler Inhale 1 puff into the lungs every 6 (six) hours as needed for shortness of breath.     amLODipine (NORVASC) 2.5 MG tablet Take 2.5 mg by mouth daily.     ciprofloxacin (CIPRO) 500 MG tablet Take 500 mg by mouth 2 (two) times daily.     metroNIDAZOLE (FLAGYL) 500 MG tablet Take 500 mg by mouth 3 (three) times daily.     fexofenadine (ALLEGRA ALLERGY) 180 MG tablet Take 180 mg by mouth daily.     rosuvastatin (CRESTOR) 5 MG tablet Take 5 mg by mouth daily.     No current facility-administered medications for this visit.    REVIEW OF SYSTEMS:   Constitutional: Denies fevers, chills or abnormal night sweats Eyes: Denies blurriness of vision, double vision or watery eyes Ears, nose, mouth, throat, and face: Denies mucositis or sore throat Respiratory: Denies cough, dyspnea or wheezes Cardiovascular: Denies palpitation, chest discomfort or lower extremity swelling Gastrointestinal:  Denies nausea, heartburn or change in bowel habits Skin: Denies abnormal skin rashes Lymphatics: Denies new lymphadenopathy or easy bruising Neurological:Denies numbness, tingling or new weaknesses Behavioral/Psych: Mood is stable, no new changes  All other systems were reviewed with the patient and  are negative.  PHYSICAL EXAMINATION: ECOG PERFORMANCE STATUS: 1 - Symptomatic but completely ambulatory  Vitals:   01/19/24 1017  BP: 126/82  Pulse: 83  Resp: 18  Temp: 98.6 F (37 C)  SpO2: 99%   Filed Weights   01/19/24 1017  Weight: 157 lb 6.4 oz (71.4 kg)    GENERAL:alert, no distress and comfortable SKIN: skin color, texture, turgor are normal, no rashes or significant lesions EYES: normal, conjunctiva are pink and non-injected, sclera clear OROPHARYNX:no exudate, no erythema and lips, buccal mucosa, and tongue normal  NECK: supple, thyroid normal size, non-tender, without nodularity LYMPH:  no palpable lymphadenopathy in the cervical, axillary or inguinal LUNGS: clear to auscultation and percussion with normal breathing effort HEART: regular rate & rhythm and no murmurs and no lower extremity edema ABDOMEN:abdomen soft, non-tender and normal bowel sounds Musculoskeletal:no cyanosis of digits and no clubbing  PSYCH: alert & oriented x 3 with fluent speech NEURO: no focal motor/sensory deficits  LABORATORY DATA:  I have reviewed the data as listed from her PCP office

## 2024-02-01 ENCOUNTER — Ambulatory Visit
Admission: RE | Admit: 2024-02-01 | Discharge: 2024-02-01 | Disposition: A | Discharge status: Home / Self Care | Source: Ambulatory Visit | Attending: Family Medicine | Admitting: Family Medicine

## 2024-02-01 ENCOUNTER — Other Ambulatory Visit

## 2024-02-01 DIAGNOSIS — N939 Abnormal uterine and vaginal bleeding, unspecified: Secondary | ICD-10-CM

## 2024-02-05 ENCOUNTER — Inpatient Hospital Stay: Attending: Hematology and Oncology

## 2024-02-05 ENCOUNTER — Emergency Department (HOSPITAL_BASED_OUTPATIENT_CLINIC_OR_DEPARTMENT_OTHER)

## 2024-02-05 ENCOUNTER — Other Ambulatory Visit: Payer: Self-pay

## 2024-02-05 ENCOUNTER — Emergency Department (HOSPITAL_BASED_OUTPATIENT_CLINIC_OR_DEPARTMENT_OTHER)
Admission: EM | Admit: 2024-02-05 | Discharge: 2024-02-05 | Disposition: A | Discharge status: Home / Self Care | Attending: Emergency Medicine | Admitting: Emergency Medicine

## 2024-02-05 ENCOUNTER — Encounter (HOSPITAL_BASED_OUTPATIENT_CLINIC_OR_DEPARTMENT_OTHER): Payer: Self-pay | Admitting: Emergency Medicine

## 2024-02-05 DIAGNOSIS — E282 Polycystic ovarian syndrome: Secondary | ICD-10-CM | POA: Insufficient documentation

## 2024-02-05 DIAGNOSIS — J45909 Unspecified asthma, uncomplicated: Secondary | ICD-10-CM | POA: Diagnosis not present

## 2024-02-05 DIAGNOSIS — K5732 Diverticulitis of large intestine without perforation or abscess without bleeding: Secondary | ICD-10-CM | POA: Diagnosis not present

## 2024-02-05 DIAGNOSIS — K5792 Diverticulitis of intestine, part unspecified, without perforation or abscess without bleeding: Secondary | ICD-10-CM

## 2024-02-05 DIAGNOSIS — C9110 Chronic lymphocytic leukemia of B-cell type not having achieved remission: Secondary | ICD-10-CM | POA: Insufficient documentation

## 2024-02-05 DIAGNOSIS — D72820 Lymphocytosis (symptomatic): Secondary | ICD-10-CM

## 2024-02-05 DIAGNOSIS — R9389 Abnormal findings on diagnostic imaging of other specified body structures: Secondary | ICD-10-CM | POA: Insufficient documentation

## 2024-02-05 DIAGNOSIS — R1032 Left lower quadrant pain: Secondary | ICD-10-CM | POA: Diagnosis present

## 2024-02-05 LAB — CBC WITH DIFFERENTIAL (CANCER CENTER ONLY)
Abs Immature Granulocytes: 0.03 K/uL (ref 0.00–0.07)
Basophils Absolute: 0 K/uL (ref 0.0–0.1)
Basophils Relative: 0 %
Eosinophils Absolute: 0 K/uL (ref 0.0–0.5)
Eosinophils Relative: 0 %
HCT: 44.2 % (ref 36.0–46.0)
Hemoglobin: 14.9 g/dL (ref 12.0–15.0)
Immature Granulocytes: 0 %
Lymphocytes Relative: 51 %
Lymphs Abs: 7.2 K/uL — ABNORMAL HIGH (ref 0.7–4.0)
MCH: 29.7 pg (ref 26.0–34.0)
MCHC: 33.7 g/dL (ref 30.0–36.0)
MCV: 88.2 fL (ref 80.0–100.0)
Monocytes Absolute: 0.6 K/uL (ref 0.1–1.0)
Monocytes Relative: 5 %
Neutro Abs: 6.2 K/uL (ref 1.7–7.7)
Neutrophils Relative %: 44 %
Platelet Count: 197 K/uL (ref 150–400)
RBC: 5.01 MIL/uL (ref 3.87–5.11)
RDW: 12.8 % (ref 11.5–15.5)
Smear Review: NORMAL
WBC Count: 14.1 K/uL — ABNORMAL HIGH (ref 4.0–10.5)
nRBC: 0 % (ref 0.0–0.2)

## 2024-02-05 LAB — COMPREHENSIVE METABOLIC PANEL WITH GFR
ALT: 23 U/L (ref 0–44)
AST: 19 U/L (ref 15–41)
Albumin: 4.6 g/dL (ref 3.5–5.0)
Alkaline Phosphatase: 90 U/L (ref 38–126)
Anion gap: 12 (ref 5–15)
BUN: 12 mg/dL (ref 6–20)
CO2: 27 mmol/L (ref 22–32)
Calcium: 9.7 mg/dL (ref 8.9–10.3)
Chloride: 103 mmol/L (ref 98–111)
Creatinine, Ser: 0.75 mg/dL (ref 0.44–1.00)
GFR, Estimated: >60 mL/min (ref >60)
Glucose, Bld: 107 mg/dL — ABNORMAL HIGH (ref 70–99)
Potassium: 3.9 mmol/L (ref 3.5–5.1)
Sodium: 142 mmol/L (ref 135–145)
Total Bilirubin: 0.5 mg/dL (ref 0.0–1.2)
Total Protein: 7.2 g/dL (ref 6.5–8.1)

## 2024-02-05 LAB — CBC
HCT: 43.8 % (ref 36.0–46.0)
Hemoglobin: 14.5 g/dL (ref 12.0–15.0)
MCH: 29.6 pg (ref 26.0–34.0)
MCHC: 33.1 g/dL (ref 30.0–36.0)
MCV: 89.4 fL (ref 80.0–100.0)
Platelets: 211 K/uL (ref 150–400)
RBC: 4.9 MIL/uL (ref 3.87–5.11)
RDW: 12.8 % (ref 11.5–15.5)
WBC: 14.2 K/uL — ABNORMAL HIGH (ref 4.0–10.5)
nRBC: 0 % (ref 0.0–0.2)

## 2024-02-05 LAB — SEDIMENTATION RATE: Sed Rate: 12 mm/h (ref 0–22)

## 2024-02-05 LAB — URINALYSIS, ROUTINE W REFLEX MICROSCOPIC
Bilirubin Urine: NEGATIVE
Glucose, UA: NEGATIVE mg/dL
Ketones, ur: NEGATIVE mg/dL
Nitrite: NEGATIVE
Protein, ur: NEGATIVE mg/dL
Specific Gravity, Urine: 1.01 (ref 1.005–1.030)
pH: 6 (ref 5.0–8.0)

## 2024-02-05 LAB — SURGICAL PATHOLOGY

## 2024-02-05 LAB — URINALYSIS, MICROSCOPIC (REFLEX)

## 2024-02-05 LAB — LIPASE, BLOOD: Lipase: 20 U/L (ref 11–51)

## 2024-02-05 MED ORDER — AMOXICILLIN-POT CLAVULANATE 875-125 MG PO TABS: 1.0000 | ORAL_TABLET | Freq: Two times a day (BID) | ORAL | 0 refills | Status: AC

## 2024-02-05 MED ORDER — AMOXICILLIN-POT CLAVULANATE 875-125 MG PO TABS
1.0000 | ORAL_TABLET | Freq: Once | ORAL | Status: AC
  Administered 2024-02-05 (×2): 1 via ORAL
  Filled 2024-02-05: qty 1

## 2024-02-05 MED ORDER — KETOROLAC TROMETHAMINE 15 MG/ML IJ SOLN
15.0000 mg | Freq: Once | INTRAMUSCULAR | Status: DC
  Filled 2024-02-05: qty 1

## 2024-02-05 MED ORDER — ONDANSETRON HCL 4 MG/2ML IJ SOLN
4.0000 mg | Freq: Once | INTRAMUSCULAR | Status: DC
  Filled 2024-02-05: qty 2

## 2024-02-05 MED ORDER — SODIUM CHLORIDE 0.9 % IV BOLUS
1000.0000 mL | Freq: Once | INTRAVENOUS | Status: AC
  Administered 2024-02-05 (×2): 1000 mL via INTRAVENOUS

## 2024-02-05 MED ORDER — IOHEXOL 300 MG/ML  SOLN
100.0000 mL | Freq: Once | INTRAMUSCULAR | Status: AC | PRN
  Administered 2024-02-05 (×2): 100 mL via INTRAVENOUS

## 2024-02-05 MED ORDER — MORPHINE SULFATE (PF) 4 MG/ML IV SOLN
4.0000 mg | Freq: Once | INTRAVENOUS | Status: DC
  Filled 2024-02-05: qty 1

## 2024-02-05 NOTE — ED Triage Notes (Signed)
 Pt sent by UC- c/o LLQ abd pain, radiating to back x 3 weeks. Denies known fever. Nausea, denies emesis. Diarrhea x1 today.   Hx of diverticulitis- recently prescribed flagyl and cipro- prescription taken and finished.

## 2024-02-05 NOTE — ED Provider Notes (Signed)
 [place] [facility] Provider Note  CSN: [id number] Arrival date & time: 02/05/24 1951  Chief Complaint(s) Abdominal Pain  HPI Suzanne Webster is a 53 y.o. female with past medical history as below, significant for diverticulitis, asthma, PCOS, perimenopausal who presents to the ED with complaint of left lower quad abdominal pain  Patient reports left lower quad abdominal pain for around a month.  History of diverticulitis, feels pain today is similar.  She has no vomiting, no bleeding, no jaundice.  She was treated for diverticulitis around 2 months ago with Cipro and Flagyl.  She is pending GI evaluation for colonoscopy.  She also incidentally found to have a thickened endometrium on recent ultrasound is yet to follow with OB/GYN.    Past Medical History Past Medical History:  Diagnosis Date   Abnormal perimenopausal bleeding 01/19/2024   Acute diverticulitis 01/19/2024   Allergic rhinitis 01/19/2024   Allergy    Anxiety 01/19/2024   Asthma    Diverticulitis of colon 01/19/2024   Elevated LDL cholesterol level 01/19/2024   Lymphocytosis 01/19/2024   Polycystic ovarian syndrome 01/19/2024   Vitamin D deficiency 01/19/2024   Patient Active Problem List   Diagnosis Date Noted   Lymphocytosis 01/19/2024   Abnormal perimenopausal bleeding 01/19/2024   Acute diverticulitis 01/19/2024   Allergic rhinitis 01/19/2024   Anxiety 01/19/2024   Diverticulitis of colon 01/19/2024   Elevated LDL cholesterol level 01/19/2024   Polycystic ovarian syndrome 01/19/2024   Vitamin D deficiency 01/19/2024   Home Medication(s) Prior to Admission medications   Medication Sig Start Date End Date Taking? Authorizing Provider  amoxicillin -clavulanate (AUGMENTIN ) 875-125 MG tablet Take 1 tablet by mouth every 12 (twelve) hours. 02/05/24  Yes Elnor Savant A, DO  albuterol (VENTOLIN HFA) 108 (90 Base) MCG/ACT inhaler Inhale 1 puff into the lungs every 6 (six) hours  as needed for shortness of breath. 02/01/23   [provider]  amLODipine (NORVASC) 2.5 MG tablet Take 2.5 mg by mouth daily. 12/28/23   [provider]  fexofenadine (ALLEGRA ALLERGY) 180 MG tablet Take 180 mg by mouth daily.    [provider]  rosuvastatin (CRESTOR) 5 MG tablet Take 5 mg by mouth daily.    [provider]                                                                                                                                    Past Surgical History History reviewed. No pertinent surgical history. Family History History reviewed. No pertinent family history.  Social History Social History   Tobacco Use   Smoking status: Never  Substance Use Topics   Alcohol use: No   Drug use: No   Allergies Prednisone and Atorvastatin calcium  Review of Systems A thorough review of systems was obtained and all systems are negative except as noted in the HPI and PMH.   Physical Exam Vital Signs  I have reviewed the triage vital signs BP 138/86   Pulse 83   Temp 97.6 F (36.4 C)   Resp 18   Ht 5' 7 (1.702 m)   Wt 70.8 kg   SpO2 100%   BMI 24.43 kg/m  Physical Exam Vitals and nursing note reviewed.  Constitutional:      General: She is not in acute distress.    Appearance: Normal appearance. She is well-developed. She is not ill-appearing.  HENT:     Head: Normocephalic and atraumatic.     Right Ear: External ear normal.     Left Ear: External ear normal.     Nose: Nose normal.     Mouth/Throat:     Mouth: Mucous membranes are moist.  Eyes:     General: No scleral icterus.       Right eye: No discharge.        Left eye: No discharge.  Cardiovascular:     Rate and Rhythm: Normal rate.  Pulmonary:     Effort: Pulmonary effort is normal. No respiratory distress.     Breath sounds: No stridor.  Abdominal:     General: Abdomen is flat. There is no distension.     Tenderness: There is abdominal tenderness in the left lower  quadrant. There is no guarding or rebound.  Musculoskeletal:        General: No deformity.     Cervical back: No rigidity.  Skin:    General: Skin is warm and dry.     Coloration: Skin is not cyanotic, jaundiced or pale.  Neurological:     Mental Status: She is alert and oriented to person, place, and time.     GCS: GCS eye subscore is 4. GCS verbal subscore is 5. GCS motor subscore is 6.  Psychiatric:        Speech: Speech normal.        Behavior: Behavior normal. Behavior is cooperative.     ED Results and Treatments Labs (all labs ordered are listed, but only abnormal results are displayed) Labs Reviewed  COMPREHENSIVE METABOLIC PANEL WITH GFR - Abnormal; Notable for the following components:      Result Value   Glucose, Bld 107 (*)    All other components within normal limits  CBC - Abnormal; Notable for the following components:   WBC 14.2 (*)    All other components within normal limits  URINALYSIS, ROUTINE W REFLEX MICROSCOPIC - Abnormal; Notable for the following components:   Hgb urine dipstick SMALL (*)    Leukocytes,Ua TRACE (*)    All other components within normal limits  URINALYSIS, MICROSCOPIC (REFLEX) - Abnormal; Notable for the following components:   Bacteria, UA RARE (*)    All other components within normal limits  LIPASE, BLOOD                                                                                                                          Radiology  CT ABDOMEN PELVIS W CONTRAST Result Date: 02/05/2024 CLINICAL DATA:  Left lower quadrant abdominal pain. Nausea, vomiting and diarrhea EXAM: CT ABDOMEN AND PELVIS WITH CONTRAST TECHNIQUE: Multidetector CT imaging of the abdomen and pelvis was performed using the standard protocol following bolus administration of intravenous contrast. RADIATION DOSE REDUCTION: This exam was performed according to the departmental dose-optimization program which includes automated exposure control, adjustment of the mA  and/or kV according to patient size and/or use of iterative reconstruction technique. CONTRAST:  OMNIPAQUE  IOHEXOL  300 MG/ML  SOLN COMPARISON:  CT 09/21/2018. pelvic ultrasound 02/01/2024 FINDINGS: Lower chest: Clear lung bases. Hepatobiliary: Stable subcentimeter hypodensity in the left lobe of the liver, too small to characterize. No suspicious liver lesion. Partially distended gallbladder. No calcified gallstone or pericholecystic inflammation. No biliary dilatation. Pancreas: No ductal dilatation or inflammation. Spleen: Normal in size without focal abnormality. Adrenals/Urinary Tract: Normal adrenal glands. No hydronephrosis or perinephric edema. Homogeneous renal enhancement with symmetric excretion on delayed phase imaging. Urinary bladder is physiologically distended without wall thickening. Stomach/Bowel: Inflamed diverticulum involving the distal descending colon, series 601, image 82 consistent with diverticulitis. Minimal edema and trace free fluid in the pericolic gutter but no discrete fluid collection or perforation. No free air. Multiple additional noninflamed colonic diverticula. Minimal hiatal hernia. The stomach is otherwise unremarkable. Minimal wall thickening involving small bowel in the left abdomen in the region of diverticular inflammation likely reactive. No obstruction. The appendix is normal. Moderate colonic stool burden. Vascular/Lymphatic: Normal caliber abdominal aorta. The portal vein is patent. No suspicious lymphadenopathy. Reproductive: Endometrial thickening which was assessed on recent ultrasound. No adnexal mass. Other: Trace free fluid in the left pericolic gutter with soft tissue thickening. No discrete fluid collection. No free air. Diminutive fat containing umbilical hernia. Musculoskeletal: There are no acute or suspicious osseous abnormalities. IMPRESSION: 1. Acute diverticulitis involving the distal descending colon. No perforation or abscess. 2. Endometrial  thickening which was assessed on recent ultrasound. Electronically Signed   By: Andrea Gasman M.D.   On: 02/05/2024 22:24    Pertinent labs & imaging results that were available during my care of the patient were reviewed by me and considered in my medical decision making (see MDM for details).  Medications Ordered in ED Medications  ondansetron  (ZOFRAN ) injection 4 mg (4 mg Intravenous Not Given 02/05/24 2220)  ketorolac  (TORADOL ) 15 MG/ML injection 15 mg (0 mg Intravenous Hold 02/05/24 2221)  amoxicillin -clavulanate (AUGMENTIN ) 875-125 MG per tablet 1 tablet (has no administration in time range)  iohexol  (OMNIPAQUE ) 300 MG/ML solution 100 mL (100 mLs Intravenous Contrast Given 02/05/24 2154)  sodium chloride  0.9 % bolus 1,000 mL (1,000 mLs Intravenous New Bag/Given 02/05/24 2211)                                                                                                                                     Procedures Procedures  (including critical care time)  Medical  Decision Making / ED Course    Medical Decision Making:    Rosario Duey is a 52 y.o. female with past medical history as below, significant for diverticulitis, asthma, PCOS, perimenopausal who presents to the ED with complaint of left lower quad abdominal pain. The complaint involves an extensive differential diagnosis and also carries with it a high risk of complications and morbidity.  Serious etiology was considered. Ddx includes but is not limited to: Differential diagnosis includes but is not exclusive to , ovarian cyst, ovarian torsion, acute appendicitis, urinary tract infection, endometriosis, bowel obstruction, hernia, colitis, renal colic, gastroenteritis, volvulus etc.   Complete initial physical exam performed, notably the patient was in no acute distress, resting comfortably, abdomen nonperitoneal.    Reviewed and confirmed nursing documentation for past medical history, family history, social history.   Vital signs reviewed.    Left lower quad abdominal pain Uncomplicated diverticulitis> - She does have mild leukocytosis, no fever, no vomiting, pain is well-controlled, patient refused analgesia here. - CT shows uncomplicated diverticulitis, will start antibiotics, recommend bowel rest, recommend follow-up with GI for colonoscopy.  She has had 2 episodes in short succession, may potentially benefit from general surgery evaluation if she has further episodes of diverticulitis - Given first dose of antibiotics here in the ER  Thickened endometrium > - She had outpatient ultrasound which showed this, CT today also show this. - She is perimenopausal. - Advised patient follow-up with her OB/GYN regarding endometrial sampling as indicated  Abdominal pain precautions discussed with patient   Patient in no distress and overall condition is stable. Detailed discussions were had with the patient/guardian regarding current findings, and need for close f/u with PCP or on call doctor. The patient/guardian has been instructed to return immediately if the symptoms worsen in any way for re-evaluation. Patient/guardian verbalized understanding and is in agreement with current care plan. All questions answered prior to discharge.                    Additional history obtained: -Additional history obtained from spouse -External records from outside source obtained and reviewed including: Chart review including previous notes, labs, imaging, consultation notes including  Prior er eval, home medications, allergy list   Lab Tests: -I ordered, reviewed, and interpreted labs.   The pertinent results include:   Labs Reviewed  COMPREHENSIVE METABOLIC PANEL WITH GFR - Abnormal; Notable for the following components:      Result Value   Glucose, Bld 107 (*)    All other components within normal limits  CBC - Abnormal; Notable for the following components:   WBC 14.2 (*)    All other components  within normal limits  URINALYSIS, ROUTINE W REFLEX MICROSCOPIC - Abnormal; Notable for the following components:   Hgb urine dipstick SMALL (*)    Leukocytes,Ua TRACE (*)    All other components within normal limits  URINALYSIS, MICROSCOPIC (REFLEX) - Abnormal; Notable for the following components:   Bacteria, UA RARE (*)    All other components within normal limits  LIPASE, BLOOD    Notable for wbc elev  EKG   EKG Interpretation Date/Time:    Ventricular Rate:    PR Interval:    QRS Duration:    QT Interval:    QTC Calculation:   R Axis:      Text Interpretation:           Imaging Studies ordered: I ordered imaging studies including CTAP I independently visualized the following imaging  with scope of interpretation limited to determining acute life threatening conditions related to emergency care; findings noted above I agree with the radiologist interpretation If any imaging was obtained with contrast I closely monitored patient for any possible adverse reaction a/w contrast administration in the emergency department   Medicines ordered and prescription drug management: Meds ordered this encounter  Medications   iohexol  (OMNIPAQUE ) 300 MG/ML solution 100 mL   ondansetron  (ZOFRAN ) injection 4 mg   DISCONTD: morphine  (PF) 4 MG/ML injection 4 mg   sodium chloride  0.9 % bolus 1,000 mL   ketorolac  (TORADOL ) 15 MG/ML injection 15 mg   amoxicillin -clavulanate (AUGMENTIN ) 875-125 MG per tablet 1 tablet   amoxicillin -clavulanate (AUGMENTIN ) 875-125 MG tablet    Sig: Take 1 tablet by mouth every 12 (twelve) hours.    Dispense:  14 tablet    Refill:  0    -I have reviewed the patients home medicines and have made adjustments as needed   Consultations Obtained: na   Cardiac Monitoring: Continuous pulse oximetry interpreted by myself, 100% on ra.    Social Determinants of Health:  Diagnosis or treatment significantly limited by social determinants of health:  na   Reevaluation: After the interventions noted above, I reevaluated the patient and found that they have improved  Co morbidities that complicate the patient evaluation  Past Medical History:  Diagnosis Date   Abnormal perimenopausal bleeding 01/19/2024   Acute diverticulitis 01/19/2024   Allergic rhinitis 01/19/2024   Allergy    Anxiety 01/19/2024   Asthma    Diverticulitis of colon 01/19/2024   Elevated LDL cholesterol level 01/19/2024   Lymphocytosis 01/19/2024   Polycystic ovarian syndrome 01/19/2024   Vitamin D deficiency 01/19/2024      Dispostion: Disposition decision including need for hospitalization was considered, and patient discharged from emergency department.    Final Clinical Impression(s) / ED Diagnoses Final diagnoses:  Diverticulitis  Thickened endometrium        Elnor Jayson LABOR, DO 02/05/24 2258

## 2024-02-05 NOTE — Discharge Instructions (Addendum)
 It was a pleasure caring for you today in the emergency department.  Be sure to complete your entire antibiotics.  Recommend you follow a clear liquid diet over the next 2 weeks.  Please follow-up with gastroenterology for colonoscopy.  If he have more frequent episodes of diverticulitis you may need to be evaluated by general surgery given re- occurrence  Please follow-up with your OB/GYN regarding abnormal ultrasound findings that were discussed during your visit today  Please return to the emergency department for any worsening or worrisome symptoms.

## 2024-02-05 NOTE — ED Notes (Signed)
 Pt states she has had abdominal pain since July 21st, just finished a course of antibiotics, flagyl and cipro for suspected diverticulitis.  Pas has hx of that.   Pt has experienced N/V/D

## 2024-02-05 NOTE — ED Notes (Signed)
 Pt has declined the meds for nausea and pain, states she doesn't like the way it makes her feel.  Saline bolus infusing now

## 2024-02-06 ENCOUNTER — Other Ambulatory Visit: Payer: Self-pay | Admitting: Hematology and Oncology

## 2024-02-07 LAB — FLOW CYTOMETRY

## 2024-02-08 ENCOUNTER — Other Ambulatory Visit: Payer: Self-pay

## 2024-02-08 ENCOUNTER — Encounter: Payer: Self-pay | Admitting: Hematology and Oncology

## 2024-02-08 ENCOUNTER — Inpatient Hospital Stay (HOSPITAL_BASED_OUTPATIENT_CLINIC_OR_DEPARTMENT_OTHER): Admitting: Hematology and Oncology

## 2024-02-08 VITALS — BP 126/78 | HR 87 | Temp 99.0°F | Resp 18 | Ht 67.0 in | Wt 155.4 lb

## 2024-02-08 DIAGNOSIS — C9110 Chronic lymphocytic leukemia of B-cell type not having achieved remission: Secondary | ICD-10-CM | POA: Diagnosis not present

## 2024-02-08 DIAGNOSIS — E282 Polycystic ovarian syndrome: Secondary | ICD-10-CM | POA: Diagnosis not present

## 2024-02-08 NOTE — Assessment & Plan Note (Addendum)
 She have diagnosis of polycystic ovarian syndrome Recent CT imaging showed abnormal appearance on her uterus I recommend the patient to reach out to her gynecologist for further evaluation and sampling

## 2024-02-08 NOTE — Progress Notes (Signed)
 [facility] OFFICE PROGRESS NOTE  Patient Care Team: Seabron Lenis, MD as PCP - General (Family Medicine)  Assessment & Plan CLL (chronic lymphocytic leukemia) [facility]) We discussed pathology report and I gave her a copy I also reviewed CT imaging from August 11 So far, the patient has stage 0 CLL We discussed staging of disease We discussed natural history of CLL and currently she would not need treatment I do not recommend further imaging study in the absence of symptoms We discussed the role of molecular testing/prognostic marker for CLL to be done in 3 months I recommend skin protection due to risk of skin cancer Polycystic ovarian syndrome She have diagnosis of polycystic ovarian syndrome Recent CT imaging showed abnormal appearance on her uterus I recommend the patient to reach out to her gynecologist for further evaluation and sampling  Orders Placed This Encounter  Procedures   CMP (Cancer Center only)    Standing Status:   Future    Expiration Date:   02/07/2025   CBC with Differential (Cancer Center Only)    Standing Status:   Future    Expiration Date:   02/07/2025   Lactate dehydrogenase    Standing Status:   Future    Expiration Date:   02/07/2025   IgG, IgA, IgM    Standing Status:   Future    Expiration Date:   02/07/2025   Chronic Lymphocytic Leukemia (CLL) Profile, FISH   Miscellaneous [facility] test (send-out)    Standing Status:   Future    Expiration Date:   02/07/2025    Test name / description::   Miscellaneous [facility] test (send-out) for [id number]     Suzanne Bedford, MD  INTERVAL HISTORY: she returns for surveillance follow-up and review of test results She was recently seen in the emergency department with abdominal pain and was diagnosed with diverticulitis She was given another course of antibiotics CT imaging which showed abnormal uterine appearance I reviewed imaging study with the patient We also discussed recent test results and diagnosis of  CLL We discussed natural history of CLL and the rationale of not pursuing additional imaging I plan to see her again in 3 months for further follow-up  PHYSICAL EXAMINATION: ECOG PERFORMANCE STATUS: 1 - Symptomatic but completely ambulatory  Vitals:   02/08/24 0900  BP: 126/78  Pulse: 87  Resp: 18  Temp: 99 F (37.2 C)  SpO2: 98%   Filed Weights   02/08/24 0900  Weight: 155 lb 6.4 oz (70.5 kg)    Relevant data reviewed during this visit included CBC, CMP, flow cytometry, CT imaging from August 2025

## 2024-02-08 NOTE — Assessment & Plan Note (Addendum)
 We discussed pathology report and I gave her a copy I also reviewed CT imaging from August 11 So far, the patient has stage 0 CLL We discussed staging of disease We discussed natural history of CLL and currently she would not need treatment I do not recommend further imaging study in the absence of symptoms We discussed the role of molecular testing/prognostic marker for CLL to be done in 3 months I recommend skin protection due to risk of skin cancer

## 2024-03-26 ENCOUNTER — Other Ambulatory Visit: Payer: Self-pay | Admitting: Family Medicine

## 2024-03-26 DIAGNOSIS — Z1231 Encounter for screening mammogram for malignant neoplasm of breast: Secondary | ICD-10-CM

## 2024-04-25 ENCOUNTER — Ambulatory Visit

## 2024-05-02 ENCOUNTER — Inpatient Hospital Stay: Attending: Hematology and Oncology

## 2024-05-02 ENCOUNTER — Other Ambulatory Visit: Payer: Self-pay | Admitting: Hematology and Oncology

## 2024-05-02 DIAGNOSIS — R300 Dysuria: Secondary | ICD-10-CM | POA: Diagnosis not present

## 2024-05-02 DIAGNOSIS — N924 Excessive bleeding in the premenopausal period: Secondary | ICD-10-CM | POA: Insufficient documentation

## 2024-05-02 DIAGNOSIS — C9110 Chronic lymphocytic leukemia of B-cell type not having achieved remission: Secondary | ICD-10-CM | POA: Diagnosis present

## 2024-05-02 LAB — CBC WITH DIFFERENTIAL (CANCER CENTER ONLY)
Abs Immature Granulocytes: 0.02 K/uL (ref 0.00–0.07)
Basophils Absolute: 0 K/uL (ref 0.0–0.1)
Basophils Relative: 0 %
Eosinophils Absolute: 0.1 K/uL (ref 0.0–0.5)
Eosinophils Relative: 1 %
HCT: 44 % (ref 36.0–46.0)
Hemoglobin: 14.7 g/dL (ref 12.0–15.0)
Immature Granulocytes: 0 %
Lymphocytes Relative: 71 %
Lymphs Abs: 6.2 K/uL — ABNORMAL HIGH (ref 0.7–4.0)
MCH: 29.5 pg (ref 26.0–34.0)
MCHC: 33.4 g/dL (ref 30.0–36.0)
MCV: 88.2 fL (ref 80.0–100.0)
Monocytes Absolute: 0.4 K/uL (ref 0.1–1.0)
Monocytes Relative: 4 %
Neutro Abs: 2.2 K/uL (ref 1.7–7.7)
Neutrophils Relative %: 24 %
Platelet Count: 183 K/uL (ref 150–400)
RBC: 4.99 MIL/uL (ref 3.87–5.11)
RDW: 12.7 % (ref 11.5–15.5)
WBC Count: 8.9 K/uL (ref 4.0–10.5)
nRBC: 0 % (ref 0.0–0.2)

## 2024-05-02 LAB — CMP (CANCER CENTER ONLY)
ALT: 18 U/L (ref 0–44)
AST: 17 U/L (ref 15–41)
Albumin: 4.5 g/dL (ref 3.5–5.0)
Alkaline Phosphatase: 83 U/L (ref 38–126)
Anion gap: 6 (ref 5–15)
BUN: 19 mg/dL (ref 6–20)
CO2: 30 mmol/L (ref 22–32)
Calcium: 9.7 mg/dL (ref 8.9–10.3)
Chloride: 105 mmol/L (ref 98–111)
Creatinine: 0.74 mg/dL (ref 0.44–1.00)
GFR, Estimated: >60 mL/min (ref >60)
Glucose, Bld: 89 mg/dL (ref 70–99)
Potassium: 4.1 mmol/L (ref 3.5–5.1)
Sodium: 141 mmol/L (ref 135–145)
Total Bilirubin: 0.4 mg/dL (ref 0.0–1.2)
Total Protein: 7.2 g/dL (ref 6.5–8.1)

## 2024-05-02 LAB — LACTATE DEHYDROGENASE: LDH: 139 U/L (ref 98–192)

## 2024-05-03 LAB — IGG, IGA, IGM
IgA: 115 mg/dL (ref 87–352)
IgG (Immunoglobin G), Serum: 878 mg/dL (ref 586–1602)
IgM (Immunoglobulin M), Srm: 45 mg/dL (ref 26–217)

## 2024-05-07 ENCOUNTER — Ambulatory Visit

## 2024-05-07 ENCOUNTER — Ambulatory Visit
Admission: RE | Admit: 2024-05-07 | Discharge: 2024-05-07 | Disposition: A | Discharge status: Home / Self Care | Source: Ambulatory Visit | Attending: Family Medicine | Admitting: Family Medicine

## 2024-05-07 DIAGNOSIS — Z1231 Encounter for screening mammogram for malignant neoplasm of breast: Secondary | ICD-10-CM

## 2024-05-09 LAB — FISH HES LEUKEMIA, 4Q12 REA
Cells Analyzed: 200
Cells Counted:: 200

## 2024-05-14 ENCOUNTER — Encounter: Payer: Self-pay | Admitting: Hematology and Oncology

## 2024-05-14 ENCOUNTER — Encounter

## 2024-05-16 ENCOUNTER — Encounter: Payer: Self-pay | Admitting: Hematology and Oncology

## 2024-05-16 ENCOUNTER — Inpatient Hospital Stay

## 2024-05-16 ENCOUNTER — Inpatient Hospital Stay (HOSPITAL_BASED_OUTPATIENT_CLINIC_OR_DEPARTMENT_OTHER): Admitting: Hematology and Oncology

## 2024-05-16 ENCOUNTER — Other Ambulatory Visit: Payer: Self-pay

## 2024-05-16 VITALS — BP 157/99 | HR 86 | Temp 99.6°F | Resp 18 | Ht 67.0 in | Wt 163.2 lb

## 2024-05-16 DIAGNOSIS — N924 Excessive bleeding in the premenopausal period: Secondary | ICD-10-CM

## 2024-05-16 DIAGNOSIS — R300 Dysuria: Secondary | ICD-10-CM

## 2024-05-16 DIAGNOSIS — C9110 Chronic lymphocytic leukemia of B-cell type not having achieved remission: Secondary | ICD-10-CM | POA: Diagnosis not present

## 2024-05-16 LAB — URINALYSIS, COMPLETE (UACMP) WITH MICROSCOPIC
Bacteria, UA: NONE SEEN
Bilirubin Urine: NEGATIVE
Glucose, UA: NEGATIVE mg/dL
Hgb urine dipstick: NEGATIVE
Ketones, ur: NEGATIVE mg/dL
Leukocytes,Ua: NEGATIVE
Nitrite: NEGATIVE
Protein, ur: NEGATIVE mg/dL
Specific Gravity, Urine: 1.006 (ref 1.005–1.030)
pH: 7 (ref 5.0–8.0)

## 2024-05-16 NOTE — Progress Notes (Signed)
 [facility] OFFICE PROGRESS NOTE  Patient Care Team: Seabron Lenis, MD as PCP - General (Family Medicine)  Assessment & Plan Abnormal perimenopausal bleeding She has intermittent vaginal spotting and symptoms of dysuria I suspect the patient might be perimenopausal and have symptoms of atrophic vaginitis I recommend ordering labs and urinalysis with urine culture to rule out UTI If confirmed, she should see her gynecologist for consideration for hormone replacement therapy Dysuria I will order urinalysis and urine culture CLL (chronic lymphocytic leukemia) (HCC) The patient was diagnosed with CLL in August 2015 after presentation with lymphocytosis FISH panel for prognostic markers revealed favorable deletion 13 q. but no other abnormalities She has no signs of anemia or thrombocytopenia or lymphadenopathy She can be safely observed Plan to see her once a year for this  Orders Placed This Encounter  Procedures   Urine Culture    Standing Status:   Future    Number of Occurrences:   1    Expected Date:   05/16/2024    Expiration Date:   05/16/2025   FSH/LH    Standing Status:   Future    Number of Occurrences:   1    Expiration Date:   05/16/2025   Estradiol     Standing Status:   Future    Number of Occurrences:   1    Expiration Date:   05/16/2025   Anti-Mullerian Hormone [facility]), Female    Standing Status:   Future    Expiration Date:   05/16/2025   Urinalysis, Complete w Microscopic    Standing Status:   Future    Number of Occurrences:   1    Expiration Date:   05/16/2025   CBC with Differential (Cancer Center Only)    Standing Status:   Future    Expiration Date:   05/16/2025     Almarie Bedford, MD  INTERVAL HISTORY: she returns for surveillance follow-up to review test results for CLL She is also complaining of symptoms of abnormal burning sensation when she urinate She has been having sporadic vaginal spotting since a year ago I reviewed test  results We discussed ordering additional tests to evaluate for her symptoms of dysuria  PHYSICAL EXAMINATION: ECOG PERFORMANCE STATUS: 1 - Symptomatic but completely ambulatory  Vitals:   05/16/24 0916  BP: (!) 157/99  Pulse: 86  Resp: 18  Temp: 99.6 F (37.6 C)  SpO2: 99%   Filed Weights   05/16/24 0916  Weight: 163 lb 3.2 oz (74 kg)    Relevant data reviewed during this visit included CBC, FISH panel for CLL, CMP, immunoglobulin levels

## 2024-05-16 NOTE — Assessment & Plan Note (Addendum)
 She has intermittent vaginal spotting and symptoms of dysuria I suspect the patient might be perimenopausal and have symptoms of atrophic vaginitis I recommend ordering labs and urinalysis with urine culture to rule out UTI If confirmed, she should see her gynecologist for consideration for hormone replacement therapy

## 2024-05-16 NOTE — Assessment & Plan Note (Addendum)
 The patient was diagnosed with CLL in August 2015 after presentation with lymphocytosis FISH panel for prognostic markers revealed favorable deletion 13 q. but no other abnormalities She has no signs of anemia or thrombocytopenia or lymphadenopathy She can be safely observed Plan to see her once a year for this

## 2024-05-17 LAB — ESTRADIOL: Estradiol: <5 pg/mL

## 2024-05-17 LAB — URINE CULTURE: Culture: <10000 — AB

## 2024-05-17 LAB — FSH/LH
FSH: 70.5 m[IU]/mL
LH: 43 m[IU]/mL

## 2024-05-23 LAB — ANTI MULLERIAN HORMONE: ANTI-MULLERIAN HORMONE (AMH): <0.015 ng/mL

## 2024-05-24 ENCOUNTER — Telehealth: Payer: Self-pay

## 2024-05-24 NOTE — Telephone Encounter (Signed)
-----   Message from Almarie Bedford sent at 05/24/2024  9:22 AM EST ----- All her hormone tests confirmed she is post menopausal Her urine test came back neg She likely has atrophic vaginitis She should be prescribed HRT, she needs to reach out to her gynecologist for management

## 2024-05-24 NOTE — Telephone Encounter (Signed)
 Called and left a message asking her to call the office back.

## 2024-05-28 ENCOUNTER — Telehealth: Payer: Self-pay

## 2024-05-28 NOTE — Telephone Encounter (Signed)
 Returned her call and left a message. Ask her to call the office for questions.   Left the same message sent in mychart message: Dr. Lonn said that all of your hormone tests confirmed that you are post menopausal. Your urine test was negative and it is likely that you have atrophic vaginitis. Please reach out to your gynecologist for management to get HRT prescribed.

## 2025-05-20 ENCOUNTER — Inpatient Hospital Stay: Admitting: Hematology and Oncology

## 2025-05-20 ENCOUNTER — Inpatient Hospital Stay
